# Patient Record
Sex: Female | Born: 1985 | Race: Black or African American | Hispanic: No | Marital: Single | Smoking: Former smoker
Health system: Southern US, Community
[De-identification: ages and names within clinical notes are randomized; demographics above are authoritative.]

## PROBLEM LIST (undated history)

## (undated) DIAGNOSIS — N938 Other specified abnormal uterine and vaginal bleeding: Secondary | ICD-10-CM

## (undated) DIAGNOSIS — I878 Other specified disorders of veins: Secondary | ICD-10-CM

## (undated) DIAGNOSIS — D649 Anemia, unspecified: Secondary | ICD-10-CM

## (undated) DIAGNOSIS — G459 Transient cerebral ischemic attack, unspecified: Secondary | ICD-10-CM

## (undated) HISTORY — PX: LAPAROSCOPIC GASTRIC SLEEVE RESECTION: SHX5895

---

## 2011-03-21 ENCOUNTER — Emergency Department (HOSPITAL_COMMUNITY): Payer: Self-pay

## 2011-03-21 ENCOUNTER — Other Ambulatory Visit (HOSPITAL_COMMUNITY): Payer: Self-pay

## 2011-03-21 ENCOUNTER — Emergency Department (HOSPITAL_COMMUNITY)
Admission: EM | Admit: 2011-03-21 | Discharge: 2011-03-22 | Disposition: A | Discharge status: Home / Self Care | Payer: Self-pay | Attending: Emergency Medicine | Admitting: Emergency Medicine

## 2011-03-21 ENCOUNTER — Encounter (HOSPITAL_COMMUNITY): Payer: Self-pay | Admitting: *Deleted

## 2011-03-21 ENCOUNTER — Other Ambulatory Visit: Payer: Self-pay

## 2011-03-21 DIAGNOSIS — Z8673 Personal history of transient ischemic attack (TIA), and cerebral infarction without residual deficits: Secondary | ICD-10-CM | POA: Insufficient documentation

## 2011-03-21 DIAGNOSIS — M542 Cervicalgia: Secondary | ICD-10-CM | POA: Insufficient documentation

## 2011-03-21 DIAGNOSIS — R071 Chest pain on breathing: Secondary | ICD-10-CM | POA: Insufficient documentation

## 2011-03-21 DIAGNOSIS — H538 Other visual disturbances: Secondary | ICD-10-CM | POA: Insufficient documentation

## 2011-03-21 DIAGNOSIS — R42 Dizziness and giddiness: Secondary | ICD-10-CM | POA: Insufficient documentation

## 2011-03-21 DIAGNOSIS — J45909 Unspecified asthma, uncomplicated: Secondary | ICD-10-CM | POA: Insufficient documentation

## 2011-03-21 DIAGNOSIS — R51 Headache: Secondary | ICD-10-CM

## 2011-03-21 DIAGNOSIS — R112 Nausea with vomiting, unspecified: Secondary | ICD-10-CM | POA: Insufficient documentation

## 2011-03-21 HISTORY — DX: Transient cerebral ischemic attack, unspecified: G45.9

## 2011-03-21 MED ORDER — DIPHENHYDRAMINE HCL 50 MG/ML IJ SOLN
25.0000 mg | Freq: Once | INTRAMUSCULAR | Status: AC
  Administered 2011-03-21: 25 mg via INTRAVENOUS
  Filled 2011-03-21: qty 1

## 2011-03-21 MED ORDER — SODIUM CHLORIDE 0.9 % IV BOLUS (SEPSIS)
1000.0000 mL | Freq: Once | INTRAVENOUS | Status: AC
  Administered 2011-03-21: 1000 mL via INTRAVENOUS

## 2011-03-21 MED ORDER — METOCLOPRAMIDE HCL 5 MG/ML IJ SOLN
10.0000 mg | Freq: Once | INTRAMUSCULAR | Status: AC
  Administered 2011-03-21: 10 mg via INTRAVENOUS
  Filled 2011-03-21: qty 2

## 2011-03-21 MED ORDER — LIDOCAINE HCL 1 % IJ SOLN
INTRAMUSCULAR | Status: AC
  Filled 2011-03-21: qty 20

## 2011-03-21 MED ORDER — HYDROCODONE-ACETAMINOPHEN 5-500 MG PO TABS: 1.0000 | ORAL_TABLET | Freq: Four times a day (QID) | ORAL | Status: AC | PRN

## 2011-03-21 NOTE — ED Notes (Signed)
Pt c/o HA x1 week, has been worsening. No relief with OTC meds. Has been making chest and neck hurt. Also c/o blurred vision.

## 2011-03-21 NOTE — ED Notes (Signed)
Pt to radiology.

## 2011-03-21 NOTE — ED Notes (Signed)
Patient is resting comfortably. Warm blanket given  

## 2011-03-21 NOTE — ED Notes (Signed)
Patient to CT for a CT guided LP. Permit signed by MD and patient

## 2011-03-21 NOTE — ED Notes (Signed)
Secondary Assessment: c/o severe headache x 1 week, consistent, pain 9/10, involves the whole head, pain is throbbing, pressure and sharp.  sensitive to light, blurry vision, double vision, dizzy. Tried Norco at home with no relief. Never been diagnosed with migraine. Pt is alert, oriented. Airway intact. No weakness.

## 2011-03-21 NOTE — ED Notes (Signed)
Report given to Night RN

## 2011-03-21 NOTE — ED Provider Notes (Signed)
History     CSN: [id number] Arrival date & time: 03/21/2011  3:32 PM   First MD Initiated Contact with Patient 03/21/11 1740      Chief Complaint  Patient presents with  . Headache    (Consider location/radiation/quality/duration/timing/severity/associated sxs/prior treatment) HPI  Patient presents to emergency department complaining of a one-week history of gradual onset diffuse headache that over the last week gradually increasing in severity with associated photophobia, nausea and vomiting, dizziness, and blurred vision. Patient states she has history of waxing and waning headaches throughout her lifetime but states that the headaches usually resolve within a day. Patient states she took a Norco from previous clavicle injury without any relief of pain. Patient states she has history of TIA but states the underlying cause was "due to stress" denying history of high cholesterol, high blood pressure, or any other known medical problems other than asthma. Patient states she recently moved from [place] and has not established care with a primary care physician in [place]. Patient states the headache is also associated with muscle aches upper neck and chest stating tenderness to palpation lateral neck and chest wall pain aggravated by cough and movement. Denies fevers, chills, neck stiffness, shortness of breath, rash, difficulty ambulating, or loss of coordination.  Past Medical History  Diagnosis Date  . Asthma   . Transient ischemic attack (TIA)     History reviewed. No pertinent past surgical history.  No family history on file.  History  Substance Use Topics  . Smoking status: Never Smoker   . Smokeless tobacco: Not on file  . Alcohol Use: No    OB History    Grav Para Term Preterm Abortions TAB SAB Ect Mult Living                  Review of Systems  All other systems reviewed and are negative.    Allergies  Review of patient's allergies indicates no known  allergies.  Home Medications   Current Outpatient Rx  Name Route Sig Dispense Refill  . HYDROCODONE-ACETAMINOPHEN 5-325 MG PO TABS Oral Take 1 tablet by mouth every 6 (six) hours as needed.        BP 129/68  Pulse 77  Temp(Src) 98.7 F (37.1 C) (Oral)  Resp 20  SpO2 99%  LMP 03/14/2011  Physical Exam  Nursing note and vitals reviewed. Constitutional: She is oriented to person, place, and time. Vital signs are normal. She appears well-developed and well-nourished.  Non-toxic appearance. No distress.  HENT:  Head: Normocephalic and atraumatic.  Eyes: Conjunctivae and EOM are normal. Pupils are equal, round, and reactive to light. Right eye exhibits no nystagmus. Left eye exhibits no nystagmus.  Neck: Normal range of motion. Neck supple. No Brudzinski's sign and no Kernig's sign noted.  Cardiovascular: Normal rate, regular rhythm, normal heart sounds and intact distal pulses.  Exam reveals no gallop and no friction rub.   No murmur heard. Pulmonary/Chest: Effort normal and breath sounds normal. No respiratory distress. She has no wheezes. She has no rales. She exhibits tenderness.       Tenderness to palpation of entire anterior chest wall without crepitus, skin changes, or erythema. Tenderness to palpation also up within lateral lower aspects of neck and shoulders.  Abdominal: Bowel sounds are normal. She exhibits no distension and no mass. There is no tenderness. There is no rebound and no guarding.  Musculoskeletal: Normal range of motion. She exhibits no edema and no tenderness.  Neurological: She is alert  and oriented to person, place, and time.  Skin: Skin is warm and dry. No rash noted. She is not diaphoretic. No erythema.  Psychiatric: She has a normal mood and affect.    ED Course  Procedures (including critical care time)  IV Benadryl, Reglan, and fluids.  Patient continues to be alert and oriented with no neuro focal findings and nontoxic-appearing with improving  headache after IV pain meds and fluids are ongoing headache. Patient to sign consent form for LP to rule out pseudo-tumor cerebri.  Labs Reviewed - No data to display No results found.   No diagnosis found.    MDM  No acute findings on CT scan of head with no neurofocal findings. Patient pending LP for further evaluation.         Jenness Corner, [place] 03/22/11 [phone]

## 2011-03-21 NOTE — ED Provider Notes (Signed)
BP 119/58  Pulse 73  Temp(Src) 98.7 F (37.1 C) (Oral)  Resp 16  SpO2 100%  LMP 03/14/2011  Failed LP x2, likely 2/2 body habitus. Pt states pain improved at this time. I do not suspect SAH or meningitis/encephalitis. D/W Radiology (interventional), Since pain controlled will send home with analgesia and have LP as outpatient to evaluate for pseudotumor. Ordered in EPIC. Patient given careful instructions for follow up. Advised to return to ER at any time for worsening pain, nausea, vomiting, or if she is concerned.     Forbes Cellar, MD 03/21/11 2257

## 2011-03-21 NOTE — ED Notes (Signed)
Patient transported to X-ray.  Unable to do visual acuity at this time.

## 2011-03-24 NOTE — ED Provider Notes (Signed)
Medical screening examination/treatment/procedure(s) were conducted as a shared visit with non-physician practitioner(s) and myself.  I personally evaluated the patient during the encounter  See additional documentation by myself. Pt with headache, nausea, blurry vision from baseline. Suspect pseudotumor cerebri. CN II-XIII intact. Headache/pain contolled at this time. D/W Radiology and patient. She will have fluoro guided LP as outpatient  Forbes Cellar, MD 03/24/11 2042

## 2011-10-03 ENCOUNTER — Emergency Department (HOSPITAL_COMMUNITY): Payer: Self-pay

## 2011-10-03 ENCOUNTER — Emergency Department (HOSPITAL_COMMUNITY)
Admission: EM | Admit: 2011-10-03 | Discharge: 2011-10-03 | Disposition: A | Discharge status: Home / Self Care | Payer: Self-pay | Attending: Emergency Medicine | Admitting: Emergency Medicine

## 2011-10-03 ENCOUNTER — Encounter (HOSPITAL_COMMUNITY): Payer: Self-pay | Admitting: Emergency Medicine

## 2011-10-03 DIAGNOSIS — R109 Unspecified abdominal pain: Secondary | ICD-10-CM | POA: Insufficient documentation

## 2011-10-03 DIAGNOSIS — Z8679 Personal history of other diseases of the circulatory system: Secondary | ICD-10-CM | POA: Insufficient documentation

## 2011-10-03 DIAGNOSIS — A599 Trichomoniasis, unspecified: Secondary | ICD-10-CM | POA: Insufficient documentation

## 2011-10-03 LAB — DIFFERENTIAL
Basophils Absolute: 0 10*3/uL (ref 0.0–0.1)
Basophils Relative: 0 % (ref 0–1)
Eosinophils Absolute: 0.2 10*3/uL (ref 0.0–0.7)
Eosinophils Relative: 2 % (ref 0–5)
Lymphocytes Relative: 32 % (ref 12–46)
Lymphs Abs: 3.1 10*3/uL (ref 0.7–4.0)
Monocytes Absolute: 0.6 10*3/uL (ref 0.1–1.0)
Monocytes Relative: 6 % (ref 3–12)
Neutro Abs: 5.8 10*3/uL (ref 1.7–7.7)
Neutrophils Relative %: 60 % (ref 43–77)

## 2011-10-03 LAB — COMPREHENSIVE METABOLIC PANEL
ALT: 9 U/L (ref 0–35)
AST: 14 U/L (ref 0–37)
Albumin: 3.2 g/dL — ABNORMAL LOW (ref 3.5–5.2)
Alkaline Phosphatase: 86 U/L (ref 39–117)
BUN: 7 mg/dL (ref 6–23)
CO2: 27 mEq/L (ref 19–32)
Calcium: 9.1 mg/dL (ref 8.4–10.5)
Chloride: 101 mEq/L (ref 96–112)
Creatinine, Ser: 0.71 mg/dL (ref 0.50–1.10)
GFR calc Af Amer: >90 mL/min (ref >90)
GFR calc non Af Amer: >90 mL/min (ref >90)
Glucose, Bld: 115 mg/dL — ABNORMAL HIGH (ref 70–99)
Potassium: 3.2 mEq/L — ABNORMAL LOW (ref 3.5–5.1)
Sodium: 137 mEq/L (ref 135–145)
Total Bilirubin: 0.2 mg/dL — ABNORMAL LOW (ref 0.3–1.2)
Total Protein: 7.6 g/dL (ref 6.0–8.3)

## 2011-10-03 LAB — CBC
HCT: 31.2 % — ABNORMAL LOW (ref 36.0–46.0)
Hemoglobin: 9.4 g/dL — ABNORMAL LOW (ref 12.0–15.0)
MCH: 22.7 pg — ABNORMAL LOW (ref 26.0–34.0)
MCHC: 30.1 g/dL (ref 30.0–36.0)
MCV: 75.2 fL — ABNORMAL LOW (ref 78.0–100.0)
Platelets: 255 10*3/uL (ref 150–400)
RBC: 4.15 MIL/uL (ref 3.87–5.11)
RDW: 18 % — ABNORMAL HIGH (ref 11.5–15.5)
WBC: 9.7 10*3/uL (ref 4.0–10.5)

## 2011-10-03 LAB — URINALYSIS, ROUTINE W REFLEX MICROSCOPIC
Bilirubin Urine: NEGATIVE
Glucose, UA: NEGATIVE mg/dL
Hgb urine dipstick: NEGATIVE
Ketones, ur: NEGATIVE mg/dL
Nitrite: NEGATIVE
Protein, ur: NEGATIVE mg/dL
Specific Gravity, Urine: 1.031 — ABNORMAL HIGH (ref 1.005–1.030)
Urobilinogen, UA: 1 mg/dL (ref 0.0–1.0)
pH: 5.5 (ref 5.0–8.0)

## 2011-10-03 LAB — LIPASE, BLOOD: Lipase: 16 U/L (ref 11–59)

## 2011-10-03 LAB — URINE MICROSCOPIC-ADD ON

## 2011-10-03 LAB — POCT PREGNANCY, URINE: Preg Test, Ur: NEGATIVE

## 2011-10-03 MED ORDER — IOHEXOL 300 MG/ML  SOLN
100.0000 mL | Freq: Once | INTRAMUSCULAR | Status: AC | PRN
  Administered 2011-10-03: 100 mL via INTRAVENOUS

## 2011-10-03 MED ORDER — METRONIDAZOLE 500 MG PO TABS: 500.0000 mg | ORAL_TABLET | Freq: Two times a day (BID) | ORAL | Status: AC

## 2011-10-03 MED ORDER — IOHEXOL 300 MG/ML  SOLN: 20.0000 mL | INTRAMUSCULAR | Status: AC

## 2011-10-03 MED ORDER — AZITHROMYCIN 250 MG PO TABS
1000.0000 mg | ORAL_TABLET | Freq: Once | ORAL | Status: AC
  Administered 2011-10-03: 1000 mg via ORAL
  Filled 2011-10-03: qty 4

## 2011-10-03 MED ORDER — NAPROXEN 500 MG PO TABS: 500.0000 mg | ORAL_TABLET | Freq: Two times a day (BID) | ORAL | Status: DC

## 2011-10-03 MED ORDER — HYDROMORPHONE HCL PF 1 MG/ML IJ SOLN
1.0000 mg | Freq: Once | INTRAMUSCULAR | Status: AC
  Administered 2011-10-03: 1 mg via INTRAVENOUS
  Filled 2011-10-03: qty 1

## 2011-10-03 MED ORDER — PROMETHAZINE HCL 25 MG PO TABS: 25.0000 mg | ORAL_TABLET | Freq: Four times a day (QID) | ORAL | Status: DC | PRN

## 2011-10-03 MED ORDER — ONDANSETRON HCL 4 MG/2ML IJ SOLN
4.0000 mg | Freq: Once | INTRAMUSCULAR | Status: AC
Start: 2011-10-03 — End: 2011-10-03
  Administered 2011-10-03: 4 mg via INTRAVENOUS
  Filled 2011-10-03: qty 2

## 2011-10-03 MED ORDER — CEFTRIAXONE SODIUM 250 MG IJ SOLR
250.0000 mg | Freq: Once | INTRAMUSCULAR | Status: AC
  Administered 2011-10-03: 250 mg via INTRAMUSCULAR
  Filled 2011-10-03: qty 250

## 2011-10-03 MED ORDER — DEXTROSE 5 % IV SOLN: 250.0000 mg | Freq: Once | INTRAVENOUS | Status: DC

## 2011-10-03 MED ORDER — OXYCODONE-ACETAMINOPHEN 5-325 MG PO TABS: 1.0000 | ORAL_TABLET | ORAL | Status: AC | PRN

## 2011-10-03 NOTE — ED Notes (Signed)
Patient in radiology

## 2011-10-03 NOTE — ED Notes (Signed)
Pt completed one cup of contrast. No n/v or pain. Reminded pt to notify when 2nd cup is complete.

## 2011-10-03 NOTE — ED Notes (Signed)
Patient waiting post medication. Will continue to monitor.

## 2011-10-03 NOTE — ED Notes (Signed)
Patient returned from radiology. No needs at this time. Will continue to monitor.

## 2011-10-03 NOTE — ED Notes (Signed)
Pt given oral contrast for CT scan. Pt aware that she needs to call when complete. Will continue to monitor.

## 2011-10-03 NOTE — ED Notes (Signed)
No reaction to medication. No swelling, rash, itching, or SOB. Pt discharged.

## 2011-10-03 NOTE — ED Provider Notes (Signed)
History     CSN: [id number]  Arrival date & time 10/03/11  0114   First MD Initiated Contact with Patient 10/03/11 0146      Chief Complaint  Patient presents with  . Abdominal Pain    (Consider location/radiation/quality/duration/timing/severity/associated sxs/prior treatment) HPI Comments: Morbidly obese 26 year old female who presents to the hospital with 2 months of right-sided abdominal pain. She states that this has been intermittent, occurs occasionally but in the last 12 hours it has been persistent. It started at 3:00 PM yesterday afternoon and has been persistent throughout the evening. It is a sharp pain a severe pain and nothing seems to make it better. It is made worse with ambulation, palpation of the right side of the abdomen. She does complain of some nausea and did have some vomiting earlier in the evening. She denies changes in bowel habits or urinary habits and has no fevers coughing shortness of breath rashes or swelling. She denies any history of abdominal surgery  Patient is a 26 y.o. female presenting with abdominal pain. The history is provided by the patient.  Abdominal Pain The primary symptoms of the illness include abdominal pain.    Past Medical History  Diagnosis Date  . Asthma   . Transient ischemic attack (TIA)     History reviewed. No pertinent past surgical history.  History reviewed. No pertinent family history.  History  Substance Use Topics  . Smoking status: Never Smoker   . Smokeless tobacco: Not on file  . Alcohol Use: No    OB History    Grav Para Term Preterm Abortions TAB SAB Ect Mult Living                  Review of Systems  Gastrointestinal: Positive for abdominal pain.  All other systems reviewed and are negative.    Allergies  Review of patient's allergies indicates no known allergies.  Home Medications   Current Outpatient Rx  Name Route Sig Dispense Refill  . HYDROCODONE-ACETAMINOPHEN 5-325 MG PO TABS Oral  Take 1 tablet by mouth every 6 (six) hours as needed. For pain    . IBUPROFEN 200 MG PO TABS Oral Take 800 mg by mouth every 6 (six) hours as needed. For pain    . METRONIDAZOLE 500 MG PO TABS Oral Take 1 tablet (500 mg total) by mouth 2 (two) times daily. 14 tablet 0  . NAPROXEN 500 MG PO TABS Oral Take 1 tablet (500 mg total) by mouth 2 (two) times daily with a meal. 30 tablet 0  . OXYCODONE-ACETAMINOPHEN 5-325 MG PO TABS Oral Take 1 tablet by mouth every 4 (four) hours as needed for pain. May take 2 tablets PO q 6 hours for severe pain - Do not take with Tylenol as this tablet already contains tylenol 15 tablet 0  . PROMETHAZINE HCL 25 MG PO TABS Oral Take 1 tablet (25 mg total) by mouth every 6 (six) hours as needed for nausea. 12 tablet 0    BP 120/57  Pulse 66  Temp(Src) 97.8 F (36.6 C) (Oral)  Resp 18  Wt 404 lb (183.253 kg)  SpO2 99%  LMP 09/14/2011  Physical Exam  Nursing note and vitals reviewed. Constitutional: She appears well-developed and well-nourished. No distress.  HENT:  Head: Normocephalic and atraumatic.  Mouth/Throat: Oropharynx is clear and moist. No oropharyngeal exudate.  Eyes: Conjunctivae and EOM are normal. Pupils are equal, round, and reactive to light. Right eye exhibits no discharge. Left eye exhibits no discharge.  No scleral icterus.  Neck: Normal range of motion. Neck supple. No JVD present. No thyromegaly present.  Cardiovascular: Normal rate, regular rhythm, normal heart sounds and intact distal pulses.  Exam reveals no gallop and no friction rub.   No murmur heard. Pulmonary/Chest: Effort normal and breath sounds normal. No respiratory distress. She has no wheezes. She has no rales.  Abdominal: Soft. Bowel sounds are normal. She exhibits no distension and no mass. There is tenderness ( Diffuse right-sided tenderness in the right lower quadrant or the right upper quadrant, no guarding, no masses). There is guarding. There is no rebound.    Musculoskeletal: Normal range of motion. She exhibits no edema and no tenderness.  Lymphadenopathy:    She has no cervical adenopathy.  Neurological: She is alert. Coordination normal.  Skin: Skin is warm and dry. No rash noted. No erythema.  Psychiatric: She has a normal mood and affect. Her behavior is normal.    ED Course  Procedures (including critical care time)  Labs Reviewed  CBC - Abnormal; Notable for the following:    Hemoglobin 9.4 (*)    HCT 31.2 (*)    MCV 75.2 (*)    MCH 22.7 (*)    RDW 18.0 (*)    All other components within normal limits  COMPREHENSIVE METABOLIC PANEL - Abnormal; Notable for the following:    Potassium 3.2 (*)    Glucose, Bld 115 (*)    Albumin 3.2 (*)    Total Bilirubin 0.2 (*)    All other components within normal limits  URINALYSIS, ROUTINE W REFLEX MICROSCOPIC - Abnormal; Notable for the following:    APPearance CLOUDY (*)    Specific Gravity, Urine 1.031 (*)    Leukocytes, UA SMALL (*)    All other components within normal limits  URINE MICROSCOPIC-ADD ON - Abnormal; Notable for the following:    Squamous Epithelial / LPF MANY (*)    Bacteria, UA MANY (*)    All other components within normal limits  DIFFERENTIAL  LIPASE, BLOOD  POCT PREGNANCY, URINE   Ct Abdomen Pelvis W Contrast  10/03/2011  *RADIOLOGY REPORT*  Clinical Data: Right upper quadrant abdominal pain  CT ABDOMEN AND PELVIS WITH CONTRAST  Technique:  Multidetector CT imaging of the abdomen and pelvis was performed following the standard protocol during bolus administration of intravenous contrast.  Contrast: 1 OMNIPAQUE IOHEXOL 300 MG/ML  SOLN, OMNIPAQUE IOHEXOL 300 MG/ML  SOLN  Comparison: None.  Findings: Limited images through the lung bases demonstrate no significant appreciable abnormality. The heart size is within normal limits. No pleural or pericardial effusion.  Suboptimal contrast opacification.  Significant streak artifact secondary to patient contact with the  gantry.  Within this limitation, no focal abnormality identified within the liver, spleen, pancreas, biliary system, adrenal glands, kidneys.  No hydronephrosis or hydroureter.  Unable to follow the ureteral course in their entirety however no calcification along the expected course  No bowel obstruction.  No CT evidence for colitis.  Normal appendix.  No free intraperitoneal air or fluid.  No lymphadenopathy.  Normal caliber vasculature.  Decompressed bladder.  Unremarkable CT appearance to the uterus and adnexa.  No acute osseous finding  IMPRESSION: Degraded by streak artifact from patient contact with the gantry and poor contrast opacification.  Within this limitation, no acute CT abnormality identified.  Original Report Authenticated By: Waneta Martins, M.D.     1. Abdominal  pain, other specified site   2. Trichomonas  MDM  Abd pain, r/o cholecystitis / appy less likely given history, VS normal.  Repeat abdominal exam shows minimal tenderness, CT scan reviewed with the patient showing no signs of acute appendicitis, cholecystitis or liver abnormalities. Her symptoms have essentially gone away, urinalysis will require culture as it appears to be contaminated. I have treated her for Trichomonas, gonorrhea and Chlamydia. She has declined a pelvic exam but due to her history of Trichomonas will treat for these comorbid infections. Vital signs are normal at this time, I have encouraged her to return within 12 hours for a repeat abdominal exam is still having pain. Resource list given for followup  Discharge Prescriptions include:  #1 Phenergan  #2 Flagyl  #3 Naprosyn  #4 Percocet  Vida Roller, MD 10/03/11 [phone]

## 2011-10-03 NOTE — ED Notes (Signed)
Pt reports RUQ abd pain onset x2 months and has become unbearable

## 2011-10-03 NOTE — Discharge Instructions (Signed)
Pelvic Infection  If you have been diagnosed with a pelvic infection such as a sexually transmitted disease, you will need to be treated with antibiotics. Please take the medicines as prescribed. Some of these tests do not come back for 1-2 days in which case if they turn positive you will receive a phone call to let you know. If you are contacted and do have an infection consistent with a sexually transmitted disease, then you will need to tell any and all sexual partners that you have had in the last 6 months no so that they can be tested and treated as well. If you should develop severe or worsening pain in your abdomen or the pelvis or develop severe fevers,nausea or vomiting that prevent you from taking your medications, return to the emergency department immediately. Otherwise contact your local physician or county health department for a follow up appointment to complete STD testing including HIV and syphilis.  See the list of phone numbers below.  RESOURCE GUIDE  Dental Problems  Patients with Medicaid: [facility] [phone] [address]                                           [phone] Phone:  [phone]                                                  Phone:  [phone]  If unable to pay or uninsured, contact:  [facility] or [facility] to become qualified for the adult dental clinic.  Chronic Pain Problems Contact [facility]  [phone] Patients need to be referred by their primary care doctor.  Insufficient Money for Medicine Contact [employer]:  call "211" or [facility] (276)197-7802.  No Primary Care Doctor Call [facility]  4082459357 Other agencies that provide inexpensive medical care    [facility]  [phone]    [facility]  [phone]    [facility]  [phone]    [facility]  [phone]    [facility]  [phone]  [facility]  [phone]  Psychological Services [facility]  [phone] [facility]  [phone] [facility]   [phone] (emergency services (934)[phone])  Substance Abuse Resources [facility]  [phone] [facility] [phone])490-1807 [facility] [place] [phone] Floydene Flock [phone] Residential & Outpatient Substance Abuse Program  [phone]  Abuse/Neglect [facility] Child Abuse Hotline 323-103-4274 [facility] Child Abuse Hotline (865)883-7144 (After Hours)  Emergency Shelter [facility] [phone]  Maternity Homes [facility] [place] [facility] [place] 9521181929 [facility] [phone]  MRSA Hotline #:   (916) 454-9739    [facility] Resources  [facility] [place]     [employer]                          [facility] [address] [place]  [address]      [address]  Blondell Reveal Phone:  [phone]                                   Phone:  [phone]                 Phone:  [phone]  [facility] Mental Health Phone:  [phone]  [facility] Child Abuse Hotline (618) 397-1471 (531)445-4489 (After Hours)   You have been diagnosed with undifferentiated abdominal pain.  Abdominal pain can be caused by many things. Your caregiver evaluates the seriousness of your pain by an examination and possibly blood or urine tests and imaging (CT scan, x-rays, ultrasound). Many cases can be observed and treated at home after initial evaluation in the emergency department. Even though you are being discharged home, abdominal pain can be unpredictable. Therefore, you need a repeat exam if your pain does not resolve, returns, or worsens. Most patient's with abdominal pain do not need to be admitted to the  hospital or have surgery, but serious problems like appendicitis and gallbladder attacks can start out as nonspecific pain. Many abdominal conditions cannot be diagnosed in 1 visit, so followup evaluations are very important.  Seek immediate medical attention if:  *The pain does not go away or becomes severe. *Temperature above 101 develops *Repeated vomiting occurs(multiple episodes) *The pain becomes localized to portions of the abdomen. The right side could possibly be appendicitis. In an adult, the left lower portion of the abdomen could be colitis or diverticulitis. *Blood is being passed in stools or vomit *Return also if you develop chest pain, difficulty breathing, dizziness or fainting, or become confused poorly responsive or inconsolable (young children).

## 2011-10-03 NOTE — ED Notes (Signed)
Radiology made aware that pt is done with contrast.  

## 2011-10-03 NOTE — ED Notes (Signed)
Pt stated that she has been having right quadrant pain that radiates to right flank x 2 months. No n/v/d. No poor appetite. Pt stated that pain is worse with deep breaths. No burning with urination. No vaginal itching or discharge. Will continue to monitor.

## 2011-10-04 LAB — URINE CULTURE
Colony Count: >=100000
Culture  Setup Time: 201305201307

## 2012-05-26 ENCOUNTER — Emergency Department (INDEPENDENT_AMBULATORY_CARE_PROVIDER_SITE_OTHER)
Admission: EM | Admit: 2012-05-26 | Discharge: 2012-05-26 | Disposition: A | Discharge status: Home / Self Care | Payer: Self-pay | Source: Home / Self Care | Attending: Family Medicine | Admitting: Family Medicine

## 2012-05-26 ENCOUNTER — Encounter (HOSPITAL_COMMUNITY): Payer: Self-pay | Admitting: Emergency Medicine

## 2012-05-26 DIAGNOSIS — B3789 Other sites of candidiasis: Secondary | ICD-10-CM

## 2012-05-26 DIAGNOSIS — J45909 Unspecified asthma, uncomplicated: Secondary | ICD-10-CM

## 2012-05-26 DIAGNOSIS — J069 Acute upper respiratory infection, unspecified: Secondary | ICD-10-CM

## 2012-05-26 MED ORDER — IBUPROFEN 600 MG PO TABS: 600.0000 mg | ORAL_TABLET | Freq: Three times a day (TID) | ORAL | Status: DC | PRN

## 2012-05-26 MED ORDER — ALBUTEROL SULFATE HFA 108 (90 BASE) MCG/ACT IN AERS: 1.0000 | INHALATION_SPRAY | Freq: Four times a day (QID) | RESPIRATORY_TRACT | Status: DC | PRN

## 2012-05-26 MED ORDER — CETIRIZINE-PSEUDOEPHEDRINE ER 5-120 MG PO TB12: 1.0000 | ORAL_TABLET | Freq: Two times a day (BID) | ORAL | Status: DC

## 2012-05-26 MED ORDER — NYSTATIN 100000 UNIT/GM EX CREA: TOPICAL_CREAM | Freq: Two times a day (BID) | CUTANEOUS | Status: DC

## 2012-05-26 MED ORDER — GUAIFENESIN-CODEINE 100-10 MG/5ML PO SYRP: 5.0000 mL | ORAL_SOLUTION | Freq: Three times a day (TID) | ORAL | Status: DC | PRN

## 2012-05-26 NOTE — ED Notes (Signed)
Reports cough, headache , chest soreness, generalized aching, episodes of sweating, chills.  Onset Wednesday of symptoms.  No known fever .

## 2012-05-26 NOTE — ED Notes (Signed)
Provided blankets and repositioned bed for patient comfort

## 2012-05-31 NOTE — ED Provider Notes (Signed)
History     CSN: [id number]  Arrival date & time 05/26/12  1249   First MD Initiated Contact with Patient 05/26/12 1258      Chief Complaint  Patient presents with  . URI    (Consider location/radiation/quality/duration/timing/severity/associated sxs/prior treatment) HPI Comments: 27 y/o female non smoker with h/o asthma here c/o dry cough, nasal congestion, headaches, general malaise and body aches for 3 days. Symptoms associated with intermittent wheezing. Patient using expired inhaler with minimal improvement. Denies pleuritic type of chest pain.  Also c/o a pruriginous rash in chest below both breasts was initially itching now burning. Has not taken any medications today. Appetite stable.    Past Medical History  Diagnosis Date  . Asthma   . Transient ischemic attack (TIA)     History reviewed. No pertinent past surgical history.  No family history on file.  History  Substance Use Topics  . Smoking status: Never Smoker   . Smokeless tobacco: Not on file  . Alcohol Use: No    OB History    Grav Para Term Preterm Abortions TAB SAB Ect Mult Living                  Review of Systems  Constitutional: Positive for chills. Negative for fever and appetite change.  HENT: Positive for congestion and rhinorrhea.   Eyes: Negative for discharge.  Respiratory: Positive for cough and wheezing.   Cardiovascular: Negative for chest pain.  Gastrointestinal: Negative for nausea, vomiting, abdominal pain and diarrhea.  Genitourinary: Negative for dysuria, frequency and hematuria.  Musculoskeletal: Positive for myalgias and arthralgias. Negative for joint swelling.  Skin: Negative for rash.  Neurological: Positive for headaches. Negative for dizziness.  All other systems reviewed and are negative.    Allergies  Review of patient's allergies indicates no known allergies.  Home Medications   Current Outpatient Rx  Name  Route  Sig  Dispense  Refill  . ALBUTEROL SULFATE  HFA 108 (90 BASE) MCG/ACT IN AERS   Inhalation   Inhale 1-2 puffs into the lungs every 6 (six) hours as needed for wheezing.   1 Inhaler   0   . CETIRIZINE-PSEUDOEPHEDRINE ER 5-120 MG PO TB12   Oral   Take 1 tablet by mouth 2 (two) times daily. As needed nasal congestion   20 tablet   0   . GUAIFENESIN-CODEINE 100-10 MG/5ML PO SYRP   Oral   Take 5 mLs by mouth 3 (three) times daily as needed for cough.   120 mL   0   . IBUPROFEN 600 MG PO TABS   Oral   Take 1 tablet (600 mg total) by mouth every 8 (eight) hours as needed for pain or fever.   30 tablet   0   . NYSTATIN 100000 UNIT/GM EX CREA   Topical   Apply topically 2 (two) times daily.   30 g   0     BP 133/83  Pulse 83  Temp 99.6 F (37.6 C) (Oral)  Resp 18  SpO2 98%  LMP 05/12/2012  Physical Exam  Vitals reviewed. Constitutional: She is oriented to person, place, and time. She appears well-developed and well-nourished. No distress.  HENT:  Head: Normocephalic and atraumatic.       Nasal Congestion with erythema and swelling of nasal turbinates, clear rhinorrhea. pharyngeal erythema no exudates. No uvula deviation. No trismus. TM's with increased vascular markings and some dullness bilaterally no swelling or bulging   Eyes: Conjunctivae normal and EOM  are normal. Pupils are equal, round, and reactive to light. Right eye exhibits no discharge. Left eye exhibits no discharge. No scleral icterus.  Neck: Neck supple. No JVD present. No thyromegaly present.  Cardiovascular: Normal rate, regular rhythm, normal heart sounds and intact distal pulses.  Exam reveals no gallop and no friction rub.   No murmur heard. Pulmonary/Chest: Effort normal and breath sounds normal. No respiratory distress. She has no rales. She exhibits no tenderness.       Very mild sporadic expiratory rhonchi. No active wheezing. No tachypnea or orthopnea.   Abdominal: Soft. She exhibits no distension. There is no tenderness. There is no  rebound and no guarding.  Lymphadenopathy:    She has no cervical adenopathy.  Neurological: She is alert and oriented to person, place, and time.  Skin: No rash noted. She is not diaphoretic.    ED Course  Procedures (including critical care time)  Labs Reviewed - No data to display No results found.   1. Upper respiratory infection   2. Asthma   3. Candidiasis of breast       MDM  Refilled albuterol. Prescribed ibuprofen, cetirizine, guaifenesin with codeine and nystatin cream. Supportive care and red flags that should prompt her return to medical attention discussed with patient and provided in writing.         Sharin Grave, MD 05/31/12 [phone]

## 2012-10-13 ENCOUNTER — Encounter (HOSPITAL_COMMUNITY): Payer: Self-pay | Admitting: Emergency Medicine

## 2012-10-13 ENCOUNTER — Emergency Department (INDEPENDENT_AMBULATORY_CARE_PROVIDER_SITE_OTHER)
Admission: EM | Admit: 2012-10-13 | Discharge: 2012-10-13 | Disposition: A | Discharge status: Nursing Home (Includes ICF) | Payer: Self-pay | Source: Home / Self Care | Attending: Emergency Medicine | Admitting: Emergency Medicine

## 2012-10-13 ENCOUNTER — Emergency Department (HOSPITAL_COMMUNITY)
Admission: EM | Admit: 2012-10-13 | Discharge: 2012-10-13 | Disposition: A | Discharge status: Home / Self Care | Payer: Self-pay | Attending: Emergency Medicine | Admitting: Emergency Medicine

## 2012-10-13 DIAGNOSIS — I872 Venous insufficiency (chronic) (peripheral): Secondary | ICD-10-CM | POA: Insufficient documentation

## 2012-10-13 DIAGNOSIS — I8312 Varicose veins of left lower extremity with inflammation: Secondary | ICD-10-CM

## 2012-10-13 DIAGNOSIS — Z79899 Other long term (current) drug therapy: Secondary | ICD-10-CM | POA: Insufficient documentation

## 2012-10-13 DIAGNOSIS — I878 Other specified disorders of veins: Secondary | ICD-10-CM

## 2012-10-13 DIAGNOSIS — M7989 Other specified soft tissue disorders: Secondary | ICD-10-CM

## 2012-10-13 DIAGNOSIS — I8310 Varicose veins of unspecified lower extremity with inflammation: Secondary | ICD-10-CM | POA: Insufficient documentation

## 2012-10-13 DIAGNOSIS — Z8673 Personal history of transient ischemic attack (TIA), and cerebral infarction without residual deficits: Secondary | ICD-10-CM | POA: Insufficient documentation

## 2012-10-13 DIAGNOSIS — R079 Chest pain, unspecified: Secondary | ICD-10-CM

## 2012-10-13 DIAGNOSIS — I82402 Acute embolism and thrombosis of unspecified deep veins of left lower extremity: Secondary | ICD-10-CM

## 2012-10-13 DIAGNOSIS — J45909 Unspecified asthma, uncomplicated: Secondary | ICD-10-CM | POA: Insufficient documentation

## 2012-10-13 LAB — POCT I-STAT, CHEM 8
BUN: 7 mg/dL (ref 6–23)
Calcium, Ion: 1.23 mmol/L (ref 1.12–1.23)
Chloride: 105 mEq/L (ref 96–112)
Creatinine, Ser: 0.7 mg/dL (ref 0.50–1.10)
Glucose, Bld: 87 mg/dL (ref 70–99)
HCT: 31 % — ABNORMAL LOW (ref 36.0–46.0)
Hemoglobin: 10.5 g/dL — ABNORMAL LOW (ref 12.0–15.0)
Potassium: 3.7 mEq/L (ref 3.5–5.1)
Sodium: 143 mEq/L (ref 135–145)
TCO2: 31 mmol/L (ref 0–100)

## 2012-10-13 MED ORDER — TRAMADOL HCL 50 MG PO TABS
50.0000 mg | ORAL_TABLET | Freq: Once | ORAL | Status: AC
  Administered 2012-10-13: 50 mg via ORAL
  Filled 2012-10-13: qty 1

## 2012-10-13 MED ORDER — IBUPROFEN 600 MG PO TABS: 600.0000 mg | ORAL_TABLET | Freq: Four times a day (QID) | ORAL | Status: DC | PRN

## 2012-10-13 MED ORDER — HYDROCODONE-ACETAMINOPHEN 5-325 MG PO TABS
2.0000 | ORAL_TABLET | Freq: Once | ORAL | Status: AC
  Administered 2012-10-13: 2 via ORAL

## 2012-10-13 MED ORDER — HYDROCODONE-ACETAMINOPHEN 5-325 MG PO TABS: ORAL_TABLET | ORAL | Status: DC

## 2012-10-13 MED ORDER — HYDROCODONE-ACETAMINOPHEN 5-325 MG PO TABS
ORAL_TABLET | ORAL | Status: AC
  Filled 2012-10-13: qty 2

## 2012-10-13 MED ORDER — CEPHALEXIN 500 MG PO CAPS: 500.0000 mg | ORAL_CAPSULE | Freq: Four times a day (QID) | ORAL | Status: DC

## 2012-10-13 NOTE — ED Notes (Signed)
Patient reports pain in left leg from the knee down, onset Wednesday, no known injury

## 2012-10-13 NOTE — Progress Notes (Signed)
NCM, Mena Pauls spoke to pt and info was given on community clinic. Pt will make appt. Pt's Keflex was $4 at [facility]. Pt paid out of pocket and MATCH was not used. Isidoro Donning RN CCM Case Mgmt phone [phone]

## 2012-10-13 NOTE — ED Notes (Signed)
Pt sent from [facility] for eval for left leg pain with increased swelling; pt sts some intermittent CP/SOB but denies at present

## 2012-10-13 NOTE — ED Provider Notes (Signed)
Chief Complaint:   Chief Complaint  Patient presents with  . 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 She's never been given any treatment for this, and recently moved here from [place]. Over the past 4 days her left leg is swollen up more than usual, has been painful and feels hot. It feels numb, tingly, and burns. She denies any injury to the leg. She has pain with movement of the ankle and decreased range of motion the ankle. The pain is rated 9/10 in intensity. It's worse if she stands and walks but doesn't really get better she gets off her feet and elevates it. Also for the past 4-5 days she's had some pain in her left hemithorax. This is centered around the shoulder but radiates towards the sternum, upper back, and down the side of the chest. It seems to be worse with movement it is nonpleuritic. She denies any shortness of breath, cough, or hemoptysis. She has had no prior history of DVT. She has had no other risk factors such as use of estrogens or prolonged immobilization.  Review of Systems:  Other than noted above, the patient denies any of the following symptoms: Systemic:  No fever, chills, sweats, weight gain or loss. Respiratory:  No coughing, wheezing, or shortness of breath. Cardiac:  No chest pain, tightness, pressure or syncope. GI:  No abdominal pain, swelling, distension, nausea, or vomiting. GU:  No dysuria, frequency, or hematuria. Ext:  No joint pain, muscle pain, or weakness. Skin:  No rash or itching. Neuro:  No paresthesias.  PMFSH:  Past medical history, family history, social history, meds, and allergies were reviewed.    Physical Exam:   Vital signs:  BP 139/83  Pulse 72  Temp(Src) 98.3 F (36.8 C) (Oral)  Resp 18  SpO2 96%  LMP 09/27/2012 Gen:  Alert, oriented, in no distress. Neck:  No tenderness, adenopathy, or JVD. Lungs:  Breath  sounds clear and equal bilaterally.  No rales, rhonchi or wheezes. Heart:  Regular rhythm, no gallops or murmers. Abdomen:  Soft, nontender, no organomegaly or mass. Ext:  She has nonpitting edema of both legs with stasis dermatitis and hyperpigmentation. The left leg is swollen more than the right with a calf circumference of 51 cm on the left and 48 cm on the right. There is tenderness to palpation the calf and a positive Homans sign. The ankle has a decreased range of motion with pain. Pulses were full. Neuro:  Alert and oriented times 3.  No muscle weakness.  Sensation intact to light touch. Skin:  Warm and dry.  No rash or skin lesions.  Course in Urgent Care Center:  Given Norco 5/325, 2 tablets for pain.  Assessment:  The primary encounter diagnosis was Venous stasis. Diagnoses of Stasis dermatitis of both legs, DVT (deep venous thrombosis), left, and Chest pain were also pertinent to this visit.  History is concerning for DVT and possible pulmonary embolus. I think she will need further evaluation and if positive treatment.  Plan:   1.  The following meds were prescribed:   New Prescriptions   No medications on file   2.  The patient was transported to the emergency department via shuttle in stable condition.  Medical Decision Making:  27 year old female with chronic venous stasis of both legs has a 4 days history of increased pain  and swelling in left lower leg.  Denies any injury.  No prior history of DVT.  On exam she is tender in calf with non-pitting edema.  She also admits to non-pleuritic pain in left chest for past 5 days.  No shortness of breath.  Symptoms concerning for DVT and possibly PE.       Reuben Likes, MD 10/13/12 1230

## 2012-10-13 NOTE — Progress Notes (Signed)
VASCULAR LAB PRELIMINARY  PRELIMINARY  PRELIMINARY  PRELIMINARY  Left lower extremity venous duplex completed.    Preliminary report:  Left:  No obvious evidence of DVT, superficial thrombosis, or Baker's cyst.  Technically limited by body habitus and thickened skin in the calf.   Bailey Villarreal, RVT 10/13/2012, 2:16 PM

## 2012-10-13 NOTE — ED Provider Notes (Signed)
History     CSN: [id number]  Arrival date & time 10/13/12  1239   First MD Initiated Contact with Patient 10/13/12 1247      Chief Complaint  Patient presents with  . Leg Pain    (Consider location/radiation/quality/duration/timing/severity/associated sxs/prior treatment) HPI Comments: Patient with history of venous stasis both legs with stasis dermatitis, morbid obesity -- presents with complaint that over the past 4 days her left leg is swollen up more than usual, has been painful and feels hot. It feels numb, tingly, and burns at times, especially when she is walking. She denies any injury to the leg.  The pain is rated 9/10 in intensity. It's worse if she stands and walks but doesn't really get better when she sits and elevates it. Patient has had occasional pain around the shoulder but radiates towards the sternum, upper back, and down the side of the chest. This started prior to the leg swelling and has been relatively mild since the leg pain/swelling began. It seems to be worse with movement it is nonpleuritic. She denies any shortness of breath, cough, or hemoptysis. She has had no prior history of DVT. She has had no other risk factors such as use of estrogens or prolonged immobilization. Onset of symptoms gradual, course is gradually worsening. Patient was seen at [facility] prior to arrival and legs were measured, showing 3cm greater circumference in the left leg.   Patient is a 27 y.o. female presenting with leg pain. The history is provided by the patient and medical records.  Leg Pain Associated symptoms: no fever     Past Medical History  Diagnosis Date  . Asthma   . Transient ischemic attack (TIA)     History reviewed. No pertinent past surgical history.  History reviewed. No pertinent family history.  History  Substance Use Topics  . Smoking status: Never Smoker   . Smokeless tobacco: Not on file  . Alcohol Use: No    OB History   Grav Para Term Preterm Abortions TAB  SAB Ect Mult Living                  Review of Systems  Constitutional: Negative for fever.  HENT: Negative for sore throat and rhinorrhea.   Eyes: Negative for redness.  Respiratory: Negative for cough and shortness of breath.   Cardiovascular: Positive for leg swelling. Negative for chest pain.  Gastrointestinal: Negative for nausea, vomiting, abdominal pain and diarrhea.  Genitourinary: Negative for dysuria.  Musculoskeletal: Positive for arthralgias. Negative for myalgias.  Skin: Negative for rash.  Neurological: Negative for headaches.    Allergies  Review of patient's allergies indicates no known allergies.  Home Medications   Current Outpatient Rx  Name  Route  Sig  Dispense  Refill  . albuterol (PROVENTIL HFA;VENTOLIN HFA) 108 (90 BASE) MCG/ACT inhaler   Inhalation   Inhale 1-2 puffs into the lungs every 6 (six) hours as needed for wheezing.   1 Inhaler   0   . cetirizine-pseudoephedrine (ZYRTEC-D) 5-120 MG per tablet   Oral   Take 1 tablet by mouth 2 (two) times daily. As needed nasal congestion   20 tablet   0   . guaiFENesin-codeine (ROBITUSSIN AC) 100-10 MG/5ML syrup   Oral   Take 5 mLs by mouth 3 (three) times daily as needed for cough.   120 mL   0   . ibuprofen (ADVIL,MOTRIN) 600 MG tablet   Oral   Take 1 tablet (600 mg total) by  mouth every 8 (eight) hours as needed for pain or fever.   30 tablet   0   . nystatin cream (MYCOSTATIN)   Topical   Apply topically 2 (two) times daily.   30 g   0     BP 153/94  Pulse 79  Temp(Src) 98.3 F (36.8 C) (Oral)  Resp 18  Ht 6\' 1"  (1.854 m)  Wt 350 lb (158.759 kg)  BMI 46.19 kg/m2  SpO2 100%  LMP 09/27/2012  Physical Exam  Nursing note and vitals reviewed. Constitutional: She appears well-developed and well-nourished.  HENT:  Head: Normocephalic and atraumatic.  Eyes: Conjunctivae are normal. Right eye exhibits no discharge. Left eye exhibits no discharge.  Neck: Normal range of motion. Neck  supple.  Cardiovascular: Normal rate, regular rhythm and normal heart sounds.   Pulses:      Dorsalis pedis pulses are 2+ on the right side, and 2+ on the left side.       Posterior tibial pulses are 2+ on the right side, and 2+ on the left side.  Pulmonary/Chest: Effort normal and breath sounds normal. No respiratory distress. She has no wheezes. She has no rales.  Abdominal: Soft. There is no tenderness.  Musculoskeletal: She exhibits edema. She exhibits no tenderness.  Moderate to severe chronic venous stasis changes of LE bilaterally. The left lower extremity is slightly warm to touch compared to the right. There is light overlying erythema. 1+ pitting edema bilaterally. No palpable cords.   Neurological: She is alert.  Skin: Skin is warm and dry.  Psychiatric: She has a normal mood and affect.    ED Course  Procedures (including critical care time)  Labs Reviewed  POCT I-STAT, CHEM 8 - Abnormal; Notable for the following:    Hemoglobin 10.5 (*)    HCT 31.0 (*)    All other components within normal limits   No results found.   1. Venous insufficiency   2. Stasis dermatitis, acute, left     1:07 PM Patient seen and examined. Work-up initiated.   Vital signs reviewed and are as follows: Filed Vitals:   10/13/12 1244  BP: 153/94  Pulse: 79  Temp: 98.3 F (36.8 C)  Resp: 18   Ultrasound shows no DT. Patient currently has no chest pain or shortness of breath.  Patient d/w Dr. Oletta Lamas.   Patient to be discharged home on course of Keflex. Pt urged to return with worsening pain, worsening swelling, expanding area of redness or streaking up extremity, fever, or any other concerns. Urged to take complete course of antibiotics as prescribed. Counseled to take pain medications as prescribed. Pt verbalizes understanding and agrees with plan.  Patient counseled on use of narcotic pain medications. Counseled not to combine these medications with others containing tylenol. Urged not  to drink alcohol, drive, or perform any other activities that requires focus while taking these medications. The patient verbalizes understanding and agrees with the plan.  Encouraged patient to apply compression stockings and use for severe venous stasis.  Family to help patient find primary care physician.   MDM  Patients with history of severe venous stasis of bilateral lower extremities. Patient had worsening swelling and pain of left lower extremity. Initial concern is for DVT. Lower extremity Doppler did not demonstrate DVT. Mild swelling is likely due to overlying secondary cellulitis, which is mild. Patient does not have fever, nausea, vomiting, lymphangitis or any indications for admission at this time. She appears well, nontoxic. Patient does endorse vague,  intermittent chest pain that began long before the swelling of her leg and is actually well-controlled at present. Do not suspect PE given patient presentation.        Renne Crigler, [name] 10/13/12 1747

## 2012-10-13 NOTE — ED Notes (Signed)
NAD noted at time of d/c home. Will wait in ED waiting for case manager to finish with medications.

## 2012-10-14 NOTE — ED Provider Notes (Signed)
Medical screening examination/treatment/procedure(s) were performed by non-physician practitioner and as supervising physician I was immediately available for consultation/collaboration.   Gavin Pound. Ngoc Detjen, MD 10/14/12 1056

## 2013-03-16 ENCOUNTER — Encounter (HOSPITAL_COMMUNITY): Payer: Self-pay | Admitting: Emergency Medicine

## 2013-03-16 ENCOUNTER — Emergency Department (INDEPENDENT_AMBULATORY_CARE_PROVIDER_SITE_OTHER)
Admission: EM | Admit: 2013-03-16 | Discharge: 2013-03-16 | Disposition: A | Discharge status: Home / Self Care | Payer: Self-pay | Source: Home / Self Care

## 2013-03-16 DIAGNOSIS — M940 Chondrocostal junction syndrome [Tietze]: Secondary | ICD-10-CM

## 2013-03-16 HISTORY — DX: Morbid (severe) obesity due to excess calories: E66.01

## 2013-03-16 HISTORY — DX: Other specified disorders of veins: I87.8

## 2013-03-16 MED ORDER — PREDNISONE 5 MG PO TABS: 5.0000 mg | ORAL_TABLET | Freq: Every day | ORAL | Status: AC

## 2013-03-16 NOTE — ED Notes (Addendum)
States she had been worked up early in year for possible DVT/PE, but was found to be negative; this chest pain is different than any she's had in past.

## 2013-03-16 NOTE — ED Provider Notes (Signed)
CSN: [id number]     Arrival date & time 03/16/13  1330 History   None    Chief Complaint  Patient presents with  . Chest Pain   (Consider location/radiation/quality/duration/timing/severity/associated sxs/prior Treatment) HPI Comments: Patient reports a 3 day history of left chest pain and tenderness with palpation and ROM with some pain on deep inspiration. Radiates to left shoulder which is also tender to palpation and with reaching. No trauma. No history of illness prior. No N,V, diaphoresis.   Patient is a 27 y.o. female presenting with chest pain. The history is provided by the patient.  Chest Pain   Past Medical History  Diagnosis Date  . Asthma   . Transient ischemic attack (TIA)   . Venous stasis    History reviewed. No pertinent past surgical history. No family history on file. History  Substance Use Topics  . Smoking status: Former Games developer  . Smokeless tobacco: Not on file  . Alcohol Use: No   OB History   Grav Para Term Preterm Abortions TAB SAB Ect Mult Living                 Review of Systems  Cardiovascular: Positive for chest pain.  All other systems reviewed and are negative.    Allergies  Review of patient's allergies indicates no known allergies.  Home Medications   Current Outpatient Rx  Name  Route  Sig  Dispense  Refill  . cephALEXin (KEFLEX) 500 MG capsule   Oral   Take 1 capsule (500 mg total) by mouth 4 (four) times daily.   28 capsule   0   . HYDROcodone-acetaminophen (NORCO/VICODIN) 5-325 MG per tablet      Take 1-2 tablets every 6 hours as needed for severe pain   10 tablet   0   . ibuprofen (ADVIL,MOTRIN) 600 MG tablet   Oral   Take 1 tablet (600 mg total) by mouth every 6 (six) hours as needed for pain.   20 tablet   0   . predniSONE (DELTASONE) 5 MG tablet   Oral   Take 1 tablet (5 mg total) by mouth daily.   40 tablet   0    BP 149/77  Pulse 74  Temp(Src) 98.1 F (36.7 C) (Oral)  Resp 22  SpO2 100%  LMP  02/19/2013 Physical Exam  Nursing note and vitals reviewed. Constitutional: She is oriented to person, place, and time. She appears well-developed and well-nourished. No distress.  No distress looks comfortable  HENT:  Head: Normocephalic and atraumatic.  Neck: Normal range of motion. Neck supple.  Cardiovascular: Normal rate, regular rhythm and normal heart sounds.  Exam reveals no gallop and no friction rub.   No murmur heard. Pulmonary/Chest: Effort normal and breath sounds normal. No respiratory distress. She has no wheezes. She has no rales. She exhibits tenderness.  Reproducible chest pain in the left anterior chest region into left shoulder region,reproduced with ROM of left shoulder as well. No spasms are noted  Neurological: She is alert and oriented to person, place, and time.  Skin: Skin is warm and dry. She is not diaphoretic.  Psychiatric: Her behavior is normal.    ED Course  Procedures (including critical care time) Labs Review Labs Reviewed - No data to display Imaging Review No results found.  EKG Interpretation     Ventricular Rate:    PR Interval:    QRS Duration:   QT Interval:    QTC Calculation:   R Axis:  Text Interpretation:              MDM   1. Costochondritis   Normal EKG, Symptoms consistent with reprodcuible pain on exam. Treat with 6 day Prednisone. 15mg x 2 days, 10mg  x 2 days, 5 mgx 2 days (RX edited after print)  (States Ibuprofen had been unsuccessful)    Riki Sheer, [name] 03/16/13 1404

## 2013-03-16 NOTE — ED Provider Notes (Signed)
Medical screening examination/treatment/procedure(s) were performed by non-physician practitioner and as supervising physician I was immediately available for consultation/collaboration.  Timiya Howells, M.D.  Estanisl[facility] Surgeon C Zae Kirtz, MD 03/16/13 1824 

## 2013-03-16 NOTE — ED Notes (Signed)
C/O constant left chest pain x3 days radiating up into shoulder and around left ribcage.  Entire area is tender to palpation, worse with movement, and occasionally with breathing.  Does feel SOB.  Denies any injuries or change in activity.  Has tried 400mg  IBU, a family member's muscle relaxer, and heating pad without any relief.

## 2013-08-11 ENCOUNTER — Encounter (HOSPITAL_COMMUNITY): Payer: Self-pay | Admitting: Emergency Medicine

## 2013-08-11 ENCOUNTER — Emergency Department (INDEPENDENT_AMBULATORY_CARE_PROVIDER_SITE_OTHER): Payer: Managed Care, Other (non HMO)

## 2013-08-11 ENCOUNTER — Emergency Department (INDEPENDENT_AMBULATORY_CARE_PROVIDER_SITE_OTHER)
Admission: EM | Admit: 2013-08-11 | Discharge: 2013-08-11 | Disposition: A | Discharge status: Home / Self Care | Payer: Managed Care, Other (non HMO) | Source: Home / Self Care | Attending: Family Medicine | Admitting: Family Medicine

## 2013-08-11 DIAGNOSIS — M25473 Effusion, unspecified ankle: Secondary | ICD-10-CM

## 2013-08-11 DIAGNOSIS — M25476 Effusion, unspecified foot: Secondary | ICD-10-CM

## 2013-08-11 DIAGNOSIS — M25471 Effusion, right ankle: Secondary | ICD-10-CM

## 2013-08-11 DIAGNOSIS — R071 Chest pain on breathing: Secondary | ICD-10-CM

## 2013-08-11 NOTE — Discharge Instructions (Signed)
Ms. Audley Hoseates,   You have swelling of the ankle without a fracture. This should improve if you take weight off of your ankle. You can do this with a walking boot or crutches. Also take the anti-inflammatory tablets and I will prescribe. You can also ice it during the day.  Most importantly, want to encourage you to continue to lose weight. This will take the pressure off of your joints and decrease your risk of continuous pain in the ankle.  Please call the sports medicine center as needed.  Dr. Clinton SawyerWilliamson

## 2013-08-11 NOTE — ED Provider Notes (Signed)
CSN: [id number]     Arrival date & time 08/11/13  1303 History   First MD Initiated Contact with Patient 08/11/13 1351     Chief Complaint  Patient presents with  . Foot Pain   (Consider location/radiation/quality/duration/timing/severity/associated sxs/prior Treatment) HPI  Ankle pain - right-sided, one-day duration, located on the medial side of the ankle, patient having difficulty with angulation weightbearing, no redness or warmth, no recent trauma no history of surgery no falls; patient has tried nothing for relief  Past Medical History  Diagnosis Date  . Asthma   . Transient ischemic attack (TIA)   . Venous stasis   . Morbid obesity    History reviewed. No pertinent past surgical history. History reviewed. No pertinent family history. History  Substance Use Topics  . Smoking status: Former Games developermoker  . Smokeless tobacco: Not on file  . Alcohol Use: No   OB History   Grav Para Term Preterm Abortions TAB SAB Ect Mult Living                 Review of Systems See history of present illness Allergies  Review of patient's allergies indicates no known allergies.  Home Medications   Current Outpatient Rx  Name  Route  Sig  Dispense  Refill  . cephALEXin (KEFLEX) 500 MG capsule   Oral   Take 1 capsule (500 mg total) by mouth 4 (four) times daily.   28 capsule   0   . HYDROcodone-acetaminophen (NORCO/VICODIN) 5-325 MG per tablet      Take 1-2 tablets every 6 hours as needed for severe pain   10 tablet   0   . ibuprofen (ADVIL,MOTRIN) 600 MG tablet   Oral   Take 1 tablet (600 mg total) by mouth every 6 (six) hours as needed for pain.   20 tablet   0    BP 157/69  Pulse 63  Temp(Src) 98.9 F (37.2 C) (Oral)  Resp 16  SpO2 100%  LMP 07/20/2013 Physical Exam Gen: morbidly obese young [place] female, nondistressed, sitting in a wheelchair from the clinic Right leg: chronic hemosiderin deposition distal to the knee, moderate tenderness to palpation of  medial malleolus posterior medial malleolus and distal medial tibia with decreased active range of motion of the ankle; right foot neurovascularly intact without any tenderness ED Course  Procedures (including critical care time) Labs Review Labs Reviewed - No data to display Imaging Review Dg Ankle Complete Right  08/11/2013   CLINICAL DATA:  Right ankle pain medially  EXAM: RIGHT ANKLE - COMPLETE 3+ VIEW  COMPARISON:  None.  FINDINGS: Abnormal medial soft tissue swelling. No malleolar fracture. Plafond and talar dome appear intact.  Potential pes planus on the lateral projection, although projection was not obtained with patient standing. Dorsal soft tissue swelling along the talonavicular articulation.  IMPRESSION: 1. Medial and dorsal soft tissue swelling in the ankle, without observed underlying fracture. If pain persists despite conservative therapy, MRI may be warranted for further characterization.   Electronically Signed   By: Herbie BaltimoreWalt  Liebkemann M.D.   On: 08/11/2013 15:01     MDM   1. Right ankle swelling    Patient suffering from severe right ankle pain likely due to morbidly obese body habitus and poor support from her footwear. She was given a cam walker and crutches to help decrease her weightbearing in the next week. F/u with sports medicine PRN.     Garnetta BuddyEdward V Cleophas Yoak, MD 08/11/13 [phone]

## 2013-08-11 NOTE — ED Notes (Signed)
Denies injury.  Assessment per Dr. Clinton SawyerWilliamson.

## 2013-08-14 NOTE — ED Provider Notes (Signed)
Medical screening examination/treatment/procedure(s) were performed by a resident physician or non-physician practitioner and as the supervising physician I was immediately available for consultation/collaboration.  Kainalu Heggs, MD    Ebubechukwu Jedlicka S Timira Bieda, MD 08/14/13 0757 

## 2013-10-16 ENCOUNTER — Emergency Department (INDEPENDENT_AMBULATORY_CARE_PROVIDER_SITE_OTHER): Payer: Managed Care, Other (non HMO)

## 2013-10-16 ENCOUNTER — Emergency Department (INDEPENDENT_AMBULATORY_CARE_PROVIDER_SITE_OTHER)
Admission: EM | Admit: 2013-10-16 | Discharge: 2013-10-16 | Disposition: A | Discharge status: Home / Self Care | Payer: Managed Care, Other (non HMO) | Source: Home / Self Care | Attending: Family Medicine | Admitting: Family Medicine

## 2013-10-16 ENCOUNTER — Encounter (HOSPITAL_COMMUNITY): Payer: Self-pay | Admitting: Emergency Medicine

## 2013-10-16 DIAGNOSIS — R071 Chest pain on breathing: Secondary | ICD-10-CM

## 2013-10-16 DIAGNOSIS — R0789 Other chest pain: Secondary | ICD-10-CM

## 2013-10-16 MED ORDER — DICLOFENAC POTASSIUM 50 MG PO TABS: 50.0000 mg | ORAL_TABLET | Freq: Three times a day (TID) | ORAL | Status: DC

## 2013-10-16 NOTE — Discharge Instructions (Signed)
Heat and medicine as needed, activity as tolerated, return to ER  If problem gets worse.

## 2013-10-16 NOTE — ED Notes (Signed)
Pt  Reports  Pain l  Side   /  Flank  Chest  Area        Pain is  Worse  When  She  Takes  A  Deep    Breath    Or  When  She  Bends    Or  On  Certain movements

## 2013-10-16 NOTE — ED Provider Notes (Signed)
CSN: [id number]     Arrival date & time 10/16/13  1514 History   First MD Initiated Contact with Patient 10/16/13 1535     Chief Complaint  Patient presents with  . Flank Pain   (Consider location/radiation/quality/duration/timing/severity/associated sxs/prior Treatment) Patient is a 28 y.o. female presenting with flank pain. The history is provided by the patient.  Flank Pain This is a new problem. The current episode started 2 days ago (awoke with pain on mon , continues to palp and movement.). Associated symptoms include chest pain. Pertinent negatives include no shortness of breath.    Past Medical History  Diagnosis Date  . Asthma   . Transient ischemic attack (TIA)   . Venous stasis   . Morbid obesity    History reviewed. No pertinent past surgical history. History reviewed. No pertinent family history. History  Substance Use Topics  . Smoking status: Former Games developer  . Smokeless tobacco: Not on file  . Alcohol Use: No   OB History   Grav Para Term Preterm Abortions TAB SAB Ect Mult Living                 Review of Systems  Constitutional: Negative.   Respiratory: Negative for shortness of breath.   Cardiovascular: Positive for chest pain. Negative for palpitations and leg swelling.  Genitourinary: Positive for flank pain.    Allergies  Review of patient's allergies indicates no known allergies.  Home Medications   Prior to Admission medications   Medication Sig Start Date End Date Taking? Authorizing Provider  cephALEXin (KEFLEX) 500 MG capsule Take 1 capsule (500 mg total) by mouth 4 (four) times daily. 10/13/12   Renne Crigler, [name]  diclofenac (CATAFLAM) 50 MG tablet Take 1 tablet (50 mg total) by mouth 3 (three) times daily. For chest pain 10/16/13   Linna Hoff, MD  HYDROcodone-acetaminophen (NORCO/VICODIN) 5-325 MG per tablet Take 1-2 tablets every 6 hours as needed for severe pain 10/13/12   Renne Crigler, [name]  ibuprofen (ADVIL,MOTRIN) 600 MG tablet Take 1  tablet (600 mg total) by mouth every 6 (six) hours as needed for pain. 10/13/12   Renne Crigler, [name]   BP 151/93  Pulse 76  Temp(Src) 98.5 F (36.9 C) (Oral)  Resp 18  SpO2 100%  LMP 10/14/2013 Physical Exam  Nursing note and vitals reviewed. Constitutional: She is oriented to person, place, and time. She appears well-developed and well-nourished.  Cardiovascular: Regular rhythm and normal heart sounds.   Pulmonary/Chest: Effort normal and breath sounds normal. She has no rales. She exhibits tenderness.  Neurological: She is alert and oriented to person, place, and time.  Skin: Skin is warm and dry.    ED Course  Procedures (including critical care time) Labs Review Labs Reviewed - No data to display  Imaging Review Dg Chest 2 View  10/16/2013   CLINICAL DATA:  Chest pain  EXAM: CHEST  2 VIEW  COMPARISON:  None.  FINDINGS: There is no edema or consolidation. The heart appears mildly enlarged with normal pulmonary vascularity. No pneumothorax. No adenopathy. No bone lesions.  IMPRESSION: Heart appears prominent.  No edema or consolidation.   Electronically Signed   By: Bretta Bang M.D.   On: 10/16/2013 16:22    X-rays reviewed and report per radiologist.  MDM   1. Left-sided chest wall pain        Linna Hoff, MD 10/16/13 1700

## 2013-10-17 ENCOUNTER — Ambulatory Visit: Payer: Managed Care, Other (non HMO) | Admitting: Internal Medicine

## 2015-12-03 DIAGNOSIS — F4322 Adjustment disorder with anxiety: Secondary | ICD-10-CM | POA: Diagnosis not present

## 2016-01-04 DIAGNOSIS — D500 Iron deficiency anemia secondary to blood loss (chronic): Secondary | ICD-10-CM | POA: Diagnosis not present

## 2016-01-04 DIAGNOSIS — Z6841 Body Mass Index (BMI) 40.0 and over, adult: Secondary | ICD-10-CM | POA: Diagnosis not present

## 2016-01-04 DIAGNOSIS — I878 Other specified disorders of veins: Secondary | ICD-10-CM | POA: Diagnosis not present

## 2016-04-11 DIAGNOSIS — M79645 Pain in left finger(s): Secondary | ICD-10-CM | POA: Diagnosis not present

## 2016-04-27 ENCOUNTER — Emergency Department (HOSPITAL_COMMUNITY)
Admission: EM | Admit: 2016-04-27 | Discharge: 2016-04-27 | Disposition: A | Discharge status: Home / Self Care | Payer: Managed Care, Other (non HMO) | Attending: Emergency Medicine | Admitting: Emergency Medicine

## 2016-04-27 ENCOUNTER — Encounter (HOSPITAL_BASED_OUTPATIENT_CLINIC_OR_DEPARTMENT_OTHER): Payer: Self-pay

## 2016-04-27 ENCOUNTER — Emergency Department (HOSPITAL_BASED_OUTPATIENT_CLINIC_OR_DEPARTMENT_OTHER): Payer: BLUE CROSS/BLUE SHIELD

## 2016-04-27 ENCOUNTER — Encounter (HOSPITAL_COMMUNITY): Payer: Self-pay | Admitting: Emergency Medicine

## 2016-04-27 ENCOUNTER — Emergency Department (HOSPITAL_BASED_OUTPATIENT_CLINIC_OR_DEPARTMENT_OTHER)
Admission: EM | Admit: 2016-04-27 | Discharge: 2016-04-28 | Disposition: A | Discharge status: Home / Self Care | Payer: BLUE CROSS/BLUE SHIELD | Attending: Emergency Medicine | Admitting: Emergency Medicine

## 2016-04-27 DIAGNOSIS — M79662 Pain in left lower leg: Secondary | ICD-10-CM | POA: Diagnosis present

## 2016-04-27 DIAGNOSIS — R200 Anesthesia of skin: Secondary | ICD-10-CM | POA: Diagnosis not present

## 2016-04-27 DIAGNOSIS — J45909 Unspecified asthma, uncomplicated: Secondary | ICD-10-CM | POA: Insufficient documentation

## 2016-04-27 DIAGNOSIS — R0602 Shortness of breath: Secondary | ICD-10-CM | POA: Insufficient documentation

## 2016-04-27 DIAGNOSIS — Z87891 Personal history of nicotine dependence: Secondary | ICD-10-CM | POA: Diagnosis not present

## 2016-04-27 DIAGNOSIS — R079 Chest pain, unspecified: Secondary | ICD-10-CM | POA: Insufficient documentation

## 2016-04-27 DIAGNOSIS — Z8673 Personal history of transient ischemic attack (TIA), and cerebral infarction without residual deficits: Secondary | ICD-10-CM | POA: Diagnosis not present

## 2016-04-27 DIAGNOSIS — M7989 Other specified soft tissue disorders: Secondary | ICD-10-CM

## 2016-04-27 DIAGNOSIS — R0789 Other chest pain: Secondary | ICD-10-CM | POA: Diagnosis not present

## 2016-04-27 DIAGNOSIS — Z5321 Procedure and treatment not carried out due to patient leaving prior to being seen by health care provider: Secondary | ICD-10-CM | POA: Diagnosis not present

## 2016-04-27 DIAGNOSIS — R609 Edema, unspecified: Secondary | ICD-10-CM | POA: Diagnosis not present

## 2016-04-27 DIAGNOSIS — R600 Localized edema: Secondary | ICD-10-CM | POA: Insufficient documentation

## 2016-04-27 LAB — COMPREHENSIVE METABOLIC PANEL
ALT: 11 U/L — ABNORMAL LOW (ref 14–54)
AST: 16 U/L (ref 15–41)
Albumin: 3.4 g/dL — ABNORMAL LOW (ref 3.5–5.0)
Alkaline Phosphatase: 91 U/L (ref 38–126)
Anion gap: 12 (ref 5–15)
BUN: 10 mg/dL (ref 6–20)
CO2: 25 mmol/L (ref 22–32)
Calcium: 9.2 mg/dL (ref 8.9–10.3)
Chloride: 104 mmol/L (ref 101–111)
Creatinine, Ser: 0.81 mg/dL (ref 0.44–1.00)
GFR calc Af Amer: >60 mL/min (ref >60)
GFR calc non Af Amer: >60 mL/min (ref >60)
Glucose, Bld: 103 mg/dL — ABNORMAL HIGH (ref 65–99)
Potassium: 3.4 mmol/L — ABNORMAL LOW (ref 3.5–5.1)
Sodium: 141 mmol/L (ref 135–145)
Total Bilirubin: 0.1 mg/dL — ABNORMAL LOW (ref 0.3–1.2)
Total Protein: 7.9 g/dL (ref 6.5–8.1)

## 2016-04-27 LAB — CBC WITH DIFFERENTIAL/PLATELET
Basophils Absolute: 0 10*3/uL (ref 0.0–0.1)
Basophils Relative: 0 %
Eosinophils Absolute: 0.1 10*3/uL (ref 0.0–0.7)
Eosinophils Relative: 1 %
HCT: 31.2 % — ABNORMAL LOW (ref 36.0–46.0)
Hemoglobin: 9 g/dL — ABNORMAL LOW (ref 12.0–15.0)
Lymphocytes Relative: 20 %
Lymphs Abs: 2.4 10*3/uL (ref 0.7–4.0)
MCH: 21.1 pg — ABNORMAL LOW (ref 26.0–34.0)
MCHC: 28.8 g/dL — ABNORMAL LOW (ref 30.0–36.0)
MCV: 73.2 fL — ABNORMAL LOW (ref 78.0–100.0)
Monocytes Absolute: 0.7 10*3/uL (ref 0.1–1.0)
Monocytes Relative: 6 %
Neutro Abs: 8.6 10*3/uL — ABNORMAL HIGH (ref 1.7–7.7)
Neutrophils Relative %: 73 %
Platelets: 275 10*3/uL (ref 150–400)
RBC: 4.26 MIL/uL (ref 3.87–5.11)
RDW: 19 % — ABNORMAL HIGH (ref 11.5–15.5)
WBC: 11.8 10*3/uL — ABNORMAL HIGH (ref 4.0–10.5)

## 2016-04-27 LAB — PREGNANCY, URINE: Preg Test, Ur: NEGATIVE

## 2016-04-27 MED ORDER — HYDROCODONE-ACETAMINOPHEN 5-325 MG PO TABS
1.0000 | ORAL_TABLET | Freq: Once | ORAL | Status: AC
Start: 2016-04-27 — End: 2016-04-27
  Administered 2016-04-27: 1 via ORAL
  Filled 2016-04-27: qty 1

## 2016-04-27 MED ORDER — ASPIRIN 81 MG PO CHEW
324.0000 mg | CHEWABLE_TABLET | Freq: Once | ORAL | Status: AC
  Administered 2016-04-27: 324 mg via ORAL
  Filled 2016-04-27: qty 4

## 2016-04-27 NOTE — ED Triage Notes (Signed)
C/o pain, swelling to left leg x 1 week-denies injury-p[facility]

## 2016-04-27 NOTE — ED Notes (Signed)
Patient transported to X-ray 

## 2016-04-27 NOTE — ED Provider Notes (Signed)
[facility]   CSN: [id number] Arrival date & time: 04/27/16  2219 By signing my name below, I, Bailey Villarreal, attest that this documentation has been prepared under the direction and in the presence of Bailey Rhineonald Manhattan Mccuen, MD . Electronically Signed: [name], Scribe. 04/27/2016. 11:28 PM.   History   Chief Complaint Chief Complaint  Patient presents with  . Leg Pain   HPI [name] is a 30 y.o. female with a hx of asthma who presents to the Emergency Department complaining of acute on chronic swelling to left leg onset last night. She notes associated left leg pain, numbness and tingling to her LLE, CP and SOB. She reports right-sided chest pain that does not radiate onset three days ago. She describes her pain as pressure. Per pt, the pain will last about 3 hours at a time. No alleviating or modifying factors noted.  No recent injury. Pt denies any cardiac history or VTE. She is not on any chronic medications. Pt denies any nausea, vomiting, or abdominal pain.   The history is provided by the patient. No language interpreter was used.  Leg Pain   This is a chronic problem. The current episode started 12 to 24 hours ago. The problem occurs constantly. The problem has not changed since onset.The pain is present in the left lower leg. The quality of the pain is described as sharp. The pain is moderate. Associated symptoms include numbness and tingling. She has tried nothing for the symptoms. There has been no history of extremity trauma. Family history is significant for no rheumatoid arthritis and no gout.   Past Medical History:  Diagnosis Date  . Asthma   . Morbid obesity (HCC)   . Transient ischemic attack (TIA)   . Venous stasis     There are no active problems to display for this patient.   History reviewed. No pertinent surgical history.  OB History    No data available       Home Medications    Prior to Admission medications   Not on  File    Family History No family history on file.  Social History Social History  Substance Use Topics  . Smoking status: Former Games developermoker  . Smokeless tobacco: Never Used  . Alcohol use No    Allergies   Patient has no known allergies.  Review of Systems Review of Systems  Respiratory: Positive for shortness of breath. Negative for cough.   Cardiovascular: Positive for chest pain and leg swelling.  Gastrointestinal: Negative for abdominal pain, blood in stool, nausea and vomiting.  Neurological: Positive for tingling and numbness.  All other systems reviewed and are negative.  Physical Exam Updated Vital Signs BP 153/79 (BP Location: Right Arm)   Pulse 82   Temp 98.4 F (36.9 C) (Oral)   Resp 20   Ht 5\' 10"  (1.778 m)   Wt (!) 422 lb (191.4 kg)   LMP 04/20/2016 (Approximate)   SpO2 100%   BMI 60.55 kg/m   Physical Exam CONSTITUTIONAL: Well developed/well nourished HEAD: Normocephalic/atraumatic EYES: EOMI/PERRL ENMT: Mucous membranes moist NECK: supple no meningeal signs SPINE/BACK:entire spine nontender CV: S1/S2 noted, no murmurs/rubs/gallops noted LUNGS: Lungs are clear to auscultation bilaterally, no apparent distress ABDOMEN: soft, nontender,obese GU:no cva tenderness NEURO: Pt is awake/alert/appropriate, moves all extremitiesx4.  No facial droop.  Equal strength in bilateral LE EXTREMITIES: pulses normal/equal, full ROM; chronic edema and venous stasis changes noted L > R; significant left calf tenderness but no erythema,  no signs of cellulitis; DP intact, no crepitus noted SKIN: warm, color normal PSYCH: no abnormalities of mood noted, alert and oriented to situation   ED Treatments / Results  DIAGNOSTIC STUDIES:  Oxygen Saturation is 100% on RA, normal by my interpretation.    COORDINATION OF CARE:  11:24 PM Discussed treatment plan with pt at bedside and pt agreed to plan.   Labs (all labs ordered are listed, but only abnormal results are  displayed) Labs Reviewed  PREGNANCY, URINE  TROPONIN I    EKG  EKG Interpretation  Date/Time:  Thursday April 28 2016 00:20:04 EST Ventricular Rate:  75 PR Interval:    QRS Duration: 80 QT Interval:  455 QTC Calculation: 509 R Axis:   27 Text Interpretation:  Sinus rhythm Borderline T abnormalities, anterior leads No significant change since last tracing Confirmed by Bebe ShaggyWICKLINE  MD, Lanna Labella ([id number]) on 04/28/2016 12:25:10 AM       Radiology Dg Chest 2 View  Result Date: 04/28/2016 CLINICAL DATA:  Right-sided chest pain with shortness of breath and left lower leg swelling EXAM: CHEST  2 VIEW COMPARISON:  10/16/2013 FINDINGS: No focal pulmonary infiltrate, consolidation or effusion. Stable borderline to mild cardiomegaly with mild central congestion. No overt edema. No pneumothorax. IMPRESSION: Stable borderline to mild cardiomegaly with mild central congestion. No overt edema. No effusions or acute consolidations. Electronically Signed   By: Jasmine PangKim  Fujinaga M.D.   On: 04/28/2016 00:00    Procedures Procedures (including critical care time)  Medications Ordered in ED Medications  aspirin chewable tablet 324 mg (324 mg Oral Given 04/27/16 2337)  HYDROcodone-acetaminophen (NORCO/VICODIN) 5-325 MG per tablet 1 tablet (1 tablet Oral Given 04/27/16 2337)  enoxaparin (LOVENOX) injection 190 mg (190 mg Subcutaneous Given 04/28/16 0101)     Initial Impression / Assessment and Plan / ED Course  I have reviewed the triage vital signs and the nursing notes.  Pertinent labs & imaging results that were available during my care of the patient were reviewed by me and considered in my medical decision making (see chart for details).  Clinical Course     12:34 AM Pt stable Currently PERC negative EKG unchanged She has cardiomegaly - advised of this and need for monitoring and advised this is due to body habitus.   Will need to return in AM for DVT study Pt has chronic anemia, reports  this not new 1:41 AM Pt stable Appears PERC negative I have low suspicion for ACS (ekg/troponin unremarkable) I doubt dissection given history/exam She was advised about cardiomegaly seen on xray, need for f/u and monitoring  She will return later this morning for DVT study lovenox given Final Clinical Impressions(s) / ED Diagnoses   Final diagnoses:  Chest pain, unspecified type  Edema of left lower extremity    New Prescriptions New Prescriptions   No medications on file  I personally performed the services described in this documentation, which was scribed in my presence. The recorded information has been reviewed and is accurate.        Bailey Rhineonald Brandt Chaney, MD 04/28/16 [phone]

## 2016-04-27 NOTE — ED Triage Notes (Signed)
Patient reports left lower leg pain with swelling /tingling onset this week , denies fever or chills , respirations unlabored .

## 2016-04-28 ENCOUNTER — Other Ambulatory Visit (HOSPITAL_BASED_OUTPATIENT_CLINIC_OR_DEPARTMENT_OTHER): Payer: Self-pay | Admitting: Emergency Medicine

## 2016-04-28 ENCOUNTER — Ambulatory Visit (HOSPITAL_BASED_OUTPATIENT_CLINIC_OR_DEPARTMENT_OTHER): Admit: 2016-04-28 | Payer: BLUE CROSS/BLUE SHIELD

## 2016-04-28 ENCOUNTER — Ambulatory Visit (HOSPITAL_BASED_OUTPATIENT_CLINIC_OR_DEPARTMENT_OTHER)
Admission: RE | Admit: 2016-04-28 | Discharge: 2016-04-28 | Disposition: A | Discharge status: Home / Self Care | Payer: BLUE CROSS/BLUE SHIELD | Source: Ambulatory Visit | Admitting: Emergency Medicine

## 2016-04-28 ENCOUNTER — Encounter (HOSPITAL_BASED_OUTPATIENT_CLINIC_OR_DEPARTMENT_OTHER): Payer: Self-pay

## 2016-04-28 DIAGNOSIS — M7989 Other specified soft tissue disorders: Secondary | ICD-10-CM | POA: Diagnosis not present

## 2016-04-28 DIAGNOSIS — M79662 Pain in left lower leg: Secondary | ICD-10-CM

## 2016-04-28 LAB — TROPONIN I: Troponin I: <0.03 ng/mL (ref <0.03)

## 2016-04-28 MED ORDER — ENOXAPARIN SODIUM 150 MG/ML ~~LOC~~ SOLN
1.0000 mg/kg | Freq: Once | SUBCUTANEOUS | Status: AC
  Administered 2016-04-28: 190 mg via SUBCUTANEOUS
  Filled 2016-04-28: qty 2

## 2016-04-28 NOTE — Discharge Instructions (Signed)

## 2016-04-28 NOTE — ED Provider Notes (Signed)
Patient returned today for lower extremity venous ultrasound. There is no DVT found. Patient to follow up with PCP. She states she will call today for an appointment. Patient understands and agrees with plan.   Emi Holeslexandra M Mandi Mattioli, [name] 04/28/16 1055    Charlynne Panderavid Hsienta Yao, MD 04/28/16 [phone]

## 2016-05-04 DIAGNOSIS — I517 Cardiomegaly: Secondary | ICD-10-CM | POA: Diagnosis not present

## 2016-05-04 DIAGNOSIS — R609 Edema, unspecified: Secondary | ICD-10-CM | POA: Diagnosis not present

## 2016-05-04 DIAGNOSIS — M79662 Pain in left lower leg: Secondary | ICD-10-CM | POA: Diagnosis not present

## 2016-05-18 ENCOUNTER — Telehealth: Payer: Self-pay

## 2016-05-18 NOTE — Telephone Encounter (Signed)
SENT NOTES TO SCHEDULING 

## 2016-09-07 DIAGNOSIS — Z6841 Body Mass Index (BMI) 40.0 and over, adult: Secondary | ICD-10-CM | POA: Diagnosis not present

## 2016-09-07 DIAGNOSIS — Z01419 Encounter for gynecological examination (general) (routine) without abnormal findings: Secondary | ICD-10-CM | POA: Diagnosis not present

## 2016-09-07 DIAGNOSIS — Z1151 Encounter for screening for human papillomavirus (HPV): Secondary | ICD-10-CM | POA: Diagnosis not present

## 2016-09-07 DIAGNOSIS — R875 Abnormal microbiological findings in specimens from female genital organs: Secondary | ICD-10-CM | POA: Diagnosis not present

## 2016-10-11 DIAGNOSIS — M79644 Pain in right finger(s): Secondary | ICD-10-CM | POA: Diagnosis not present

## 2016-10-12 DIAGNOSIS — Z113 Encounter for screening for infections with a predominantly sexual mode of transmission: Secondary | ICD-10-CM | POA: Diagnosis not present

## 2016-10-12 DIAGNOSIS — Z1159 Encounter for screening for other viral diseases: Secondary | ICD-10-CM | POA: Diagnosis not present

## 2016-10-12 DIAGNOSIS — A5901 Trichomonal vulvovaginitis: Secondary | ICD-10-CM | POA: Diagnosis not present

## 2016-10-12 DIAGNOSIS — Z114 Encounter for screening for human immunodeficiency virus [HIV]: Secondary | ICD-10-CM | POA: Diagnosis not present

## 2016-10-12 DIAGNOSIS — Z118 Encounter for screening for other infectious and parasitic diseases: Secondary | ICD-10-CM | POA: Diagnosis not present

## 2016-11-22 ENCOUNTER — Encounter (HOSPITAL_BASED_OUTPATIENT_CLINIC_OR_DEPARTMENT_OTHER): Payer: Self-pay

## 2016-11-22 ENCOUNTER — Emergency Department (HOSPITAL_BASED_OUTPATIENT_CLINIC_OR_DEPARTMENT_OTHER)
Admission: EM | Admit: 2016-11-22 | Discharge: 2016-11-22 | Disposition: A | Discharge status: Home / Self Care | Payer: BLUE CROSS/BLUE SHIELD | Attending: Emergency Medicine | Admitting: Emergency Medicine

## 2016-11-22 ENCOUNTER — Emergency Department (HOSPITAL_BASED_OUTPATIENT_CLINIC_OR_DEPARTMENT_OTHER): Payer: BLUE CROSS/BLUE SHIELD

## 2016-11-22 DIAGNOSIS — M25561 Pain in right knee: Secondary | ICD-10-CM | POA: Insufficient documentation

## 2016-11-22 DIAGNOSIS — Z8673 Personal history of transient ischemic attack (TIA), and cerebral infarction without residual deficits: Secondary | ICD-10-CM | POA: Diagnosis not present

## 2016-11-22 DIAGNOSIS — J45909 Unspecified asthma, uncomplicated: Secondary | ICD-10-CM | POA: Insufficient documentation

## 2016-11-22 DIAGNOSIS — Z87891 Personal history of nicotine dependence: Secondary | ICD-10-CM | POA: Insufficient documentation

## 2016-11-22 MED ORDER — NAPROXEN 375 MG PO TABS: 375.0000 mg | ORAL_TABLET | Freq: Two times a day (BID) | ORAL | 0 refills | Status: DC

## 2016-11-22 MED ORDER — NAPROXEN 250 MG PO TABS
375.0000 mg | ORAL_TABLET | Freq: Once | ORAL | Status: AC
  Administered 2016-11-22: 375 mg via ORAL
  Filled 2016-11-22: qty 2

## 2016-11-22 NOTE — ED Notes (Signed)
ED Provider at bedside. 

## 2016-11-22 NOTE — ED Triage Notes (Signed)
Woke up with pain to right knee this am-denies injury-presents to triage in w/c-stood to scale for weight-NAD

## 2016-11-22 NOTE — Discharge Instructions (Signed)
Wear the knee immobilizer for comfort. Use the crutches as tolerated. Weightbearing as tolerated. Please rest, ice, compress and elevated the affected body part to help with swelling and pain. Please take the Naproxen as prescribed for pain. Do not take any additional NSAIDs including Motrin, Aleve, Ibuprofen, Advil. Follow-up with the orthopedist if symptoms are not improving.

## 2016-11-22 NOTE — ED Provider Notes (Signed)
[facility]   CSN: [id number] Arrival date & time: 11/22/16  1220     History   Chief Complaint Chief Complaint  Patient presents with  . Knee Pain    HPI Bailey Villarreal is a 31 y.o. female.  HPI 31 year old African American female past medical history significant for morbid obesity presents to the ED today with complaints of right knee pain. Patient states that she woke up this morning with complaints of right knee pain. Movement and palpation makes the pain worse. Nothing makes the pain better. She has not tried nothing for her symptoms prior to arrival. Patient has limited range of motion due to pain. Denies any paresthesias or weakness. Denies any lower extremity edema or calf tenderness. Past Medical History:  Diagnosis Date  . Asthma   . Morbid obesity (HCC)   . Transient ischemic attack (TIA)   . Venous stasis     There are no active problems to display for this patient.   History reviewed. No pertinent surgical history.  OB History    No data available       Home Medications    Prior to Admission medications   Medication Sig Start Date End Date Taking? Authorizing Provider  naproxen (NAPROSYN) 375 MG tablet Take 1 tablet (375 mg total) by mouth 2 (two) times daily. 11/22/16   Rise Mu, [name]    Family History No family history on file.  Social History Social History  Substance Use Topics  . Smoking status: Former Games developer  . Smokeless tobacco: Never Used  . Alcohol use No     Allergies   Patient has no known allergies.   Review of Systems Review of Systems  Constitutional: Negative for chills and fever.  Cardiovascular: Negative for leg swelling.  Musculoskeletal: Positive for arthralgias and gait problem. Negative for joint swelling.  Skin: Negative for wound.  Neurological: Negative for weakness and numbness.     Physical Exam Updated Vital Signs BP (!) 154/80 (BP Location: Right Wrist)   Pulse 85    Temp 98.3 F (36.8 C) (Oral)   Resp 18   Ht 5\' 9"  (1.753 m)   Wt (!) 195 kg (430 lb)   LMP 10/20/2016   SpO2 100%   BMI 63.50 kg/m   Physical Exam  Constitutional: She appears well-developed and well-nourished. No distress.  HENT:  Head: Normocephalic and atraumatic.  Eyes: Right eye exhibits no discharge. Left eye exhibits no discharge. No scleral icterus.  Neck: Normal range of motion.  Cardiovascular: Intact distal pulses.   Pulmonary/Chest: No respiratory distress.  Musculoskeletal: Normal range of motion.  Patient is tender to palpation of the anterior right knee. No erythema, edema, ecchymosis, obvious deformity noted. No lower extremity edema. Patient has limited range of motion due to pain but does have full passive range of motion. Patella tracks normally. No joint laxity with fullness and varus stress. Negative anterior drawer or Lachman's test. DP pulses are 2+ bilaterally. Sensation intact. Cap refill normal. No calf tenderness.  Neurological: She is alert.  Strength 5 out of 5 in lower x-rays.  Skin: Skin is warm and dry. Capillary refill takes less than 2 seconds. No pallor.  Psychiatric: Her behavior is normal. Judgment and thought content normal.  Nursing note and vitals reviewed.    ED Treatments / Results  Labs (all labs ordered are listed, but only abnormal results are displayed) Labs Reviewed - No data to display  EKG  EKG Interpretation None  Radiology Dg Knee Complete 4 Views Right  Result Date: 11/22/2016 CLINICAL DATA:  No known injury.  Pain anteriorly over the last day. EXAM: RIGHT KNEE - COMPLETE 4+ VIEW COMPARISON:  None. FINDINGS: No joint space narrowing. No osteophytes. No focal lesion. No effusion. IMPRESSION: Normal radiographs. Electronically Signed   By: Paulina FusiMark  Shogry M.D.   On: 11/22/2016 12:53    Procedures Procedures (including critical care time)  Medications Ordered in ED Medications  naproxen (NAPROSYN) tablet 375 mg (375  mg Oral Given 11/22/16 1431)     Initial Impression / Assessment and Plan / ED Course  I have reviewed the triage vital signs and the nursing notes.  Pertinent labs & imaging results that were available during my care of the patient were reviewed by me and considered in my medical decision making (see chart for details).     Patient resents to the ED with complaints of right knee pain. Patient with morbid obesity. Patient is neurovascularly intact. No signs of lower extremity edema or calf tenderness that would be concerning for DVT. Joint is without any erythema. Doubt septic arthritis. Patient X-Ray negative for obvious fracture or dislocation. Pain managed in ED. Pt advised to follow up with orthopedics if symptoms persist for possibility of missed fracture diagnosis. Patient given brace while in ED, conservative therapy recommended and discussed. Patient will be dc home & is agreeable with above plan.   Final Clinical Impressions(s) / ED Diagnoses   Final diagnoses:  Acute pain of right knee    New Prescriptions New Prescriptions   NAPROXEN (NAPROSYN) 375 MG TABLET    Take 1 tablet (375 mg total) by mouth 2 (two) times daily.     Rise MuLeaphart, Kenneth T, [name] 11/22/16 1735    Tegeler, Canary Brimhristopher J, MD 11/23/16 [phone]

## 2016-11-23 DIAGNOSIS — M25561 Pain in right knee: Secondary | ICD-10-CM | POA: Diagnosis not present

## 2016-11-23 DIAGNOSIS — M7651 Patellar tendinitis, right knee: Secondary | ICD-10-CM | POA: Diagnosis not present

## 2017-03-03 DIAGNOSIS — J069 Acute upper respiratory infection, unspecified: Secondary | ICD-10-CM | POA: Diagnosis not present

## 2017-03-03 DIAGNOSIS — J4520 Mild intermittent asthma, uncomplicated: Secondary | ICD-10-CM | POA: Diagnosis not present

## 2017-04-09 ENCOUNTER — Encounter (HOSPITAL_BASED_OUTPATIENT_CLINIC_OR_DEPARTMENT_OTHER): Payer: Self-pay | Admitting: Emergency Medicine

## 2017-04-09 ENCOUNTER — Other Ambulatory Visit: Payer: Self-pay

## 2017-04-09 ENCOUNTER — Emergency Department (HOSPITAL_BASED_OUTPATIENT_CLINIC_OR_DEPARTMENT_OTHER): Payer: BLUE CROSS/BLUE SHIELD

## 2017-04-09 ENCOUNTER — Emergency Department (HOSPITAL_BASED_OUTPATIENT_CLINIC_OR_DEPARTMENT_OTHER)
Admission: EM | Admit: 2017-04-09 | Discharge: 2017-04-09 | Disposition: A | Discharge status: Home / Self Care | Payer: BLUE CROSS/BLUE SHIELD | Attending: Emergency Medicine | Admitting: Emergency Medicine

## 2017-04-09 DIAGNOSIS — J45909 Unspecified asthma, uncomplicated: Secondary | ICD-10-CM | POA: Diagnosis not present

## 2017-04-09 DIAGNOSIS — Z79899 Other long term (current) drug therapy: Secondary | ICD-10-CM | POA: Diagnosis not present

## 2017-04-09 DIAGNOSIS — Z87891 Personal history of nicotine dependence: Secondary | ICD-10-CM | POA: Insufficient documentation

## 2017-04-09 DIAGNOSIS — J069 Acute upper respiratory infection, unspecified: Secondary | ICD-10-CM | POA: Diagnosis not present

## 2017-04-09 DIAGNOSIS — J309 Allergic rhinitis, unspecified: Secondary | ICD-10-CM | POA: Insufficient documentation

## 2017-04-09 DIAGNOSIS — R05 Cough: Secondary | ICD-10-CM | POA: Diagnosis not present

## 2017-04-09 DIAGNOSIS — B9789 Other viral agents as the cause of diseases classified elsewhere: Secondary | ICD-10-CM | POA: Diagnosis not present

## 2017-04-09 MED ORDER — CETIRIZINE HCL 10 MG PO TABS: 10.0000 mg | ORAL_TABLET | Freq: Every day | ORAL | 1 refills | Status: DC

## 2017-04-09 MED ORDER — FLUTICASONE PROPIONATE 50 MCG/ACT NA SUSP: 2.0000 | Freq: Every day | NASAL | 0 refills | Status: DC

## 2017-04-09 MED ORDER — BENZONATATE 100 MG PO CAPS: 100.0000 mg | ORAL_CAPSULE | Freq: Three times a day (TID) | ORAL | 0 refills | Status: DC | PRN

## 2017-04-09 MED ORDER — NAPROXEN 250 MG PO TABS: 250.0000 mg | ORAL_TABLET | Freq: Two times a day (BID) | ORAL | 0 refills | Status: DC

## 2017-04-09 NOTE — ED Notes (Addendum)
EDPA into room, prior to RN assessment, see PA notes, orders received to d/c. Care assumed at time of d/c. Pt not seen by this RN. Pt d/c'd by other RN.

## 2017-04-09 NOTE — ED Notes (Signed)
Pt given d/c instructions as per chart. Rx x 4. Verbalizes understanding. No questions. 

## 2017-04-09 NOTE — ED Triage Notes (Signed)
Productive cough with green sputum x 1 week.  

## 2017-04-09 NOTE — ED Provider Notes (Signed)
[facility] EMERGENCY DEPARTMENT Provider Note   CSN: [id number] Arrival date & time: 04/09/17  1722     History   Chief Complaint Chief Complaint  Patient presents with  . Cough    HPI Bailey Villarreal is a 31 y.o. female.  Bailey Villarreal is a 31 y.o. Female who presents to the emergency department complaining of 5 days of a cough, sneezing, nasal congestion, and postnasal drip.  Patient reports her symptoms began about 5 days ago and have been worsening with her cough.  She denies any wheezing, or shortness of breath.  She has a history of asthma, but has not needed to use her inhaler in the past 5 days.  She denies having any fevers.  No treatments attempted prior to arrival today.  She took NyQuil a couple days ago with little relief.  She reports her cough is productive.  She reports pain in her chest when she coughs.  She is not a smoker.  She denies fevers, shortness of breath, wheezing, abdominal pain, nausea, vomiting, lightheadedness, dizziness, or syncope.    The history is provided by the patient and medical records. No language interpreter was used.  Cough  Associated symptoms include ear pain and rhinorrhea. Pertinent negatives include no chest pain, no chills, no headaches, no sore throat, no myalgias, no shortness of breath and no wheezing.    Past Medical History:  Diagnosis Date  . Asthma   . Morbid obesity (HCC)   . Transient ischemic attack (TIA)   . Venous stasis     There are no active problems to display for this patient.   History reviewed. No pertinent surgical history.  OB History    No data available       Home Medications    Prior to Admission medications   Medication Sig Start Date End Date Taking? Authorizing Provider  benzonatate (TESSALON) 100 MG capsule Take 1 capsule (100 mg total) by mouth 3 (three) times daily as needed for cough. 04/09/17   [name], Liboria Putnam, [name]  cetirizine (ZYRTEC ALLERGY) 10 MG tablet Take 1 tablet (10 mg  total) by mouth daily. 04/09/17   [name], Carolanne Mercier, [name]  fluticasone (FLONASE) 50 MCG/ACT nasal spray Place 2 sprays into both nostrils daily. 04/09/17   [name], Teddrick Mallari, [name]  naproxen (NAPROSYN) 250 MG tablet Take 1 tablet (250 mg total) by mouth 2 (two) times daily with a meal. 04/09/17   [name], Zanyla Klebba, [name]    Family History No family history on file.  Social History Social History   Tobacco Use  . Smoking status: Former Games developermoker  . Smokeless tobacco: Never Used  Substance Use Topics  . Alcohol use: No  . Drug use: No     Allergies   Patient has no known allergies.   Review of Systems Review of Systems  Constitutional: Negative for chills and fever.  HENT: Positive for congestion, ear pain, postnasal drip, rhinorrhea and sneezing. Negative for ear discharge, sore throat and trouble swallowing.   Eyes: Negative for visual disturbance.  Respiratory: Positive for cough. Negative for shortness of breath and wheezing.   Cardiovascular: Negative for chest pain and palpitations.  Gastrointestinal: Negative for abdominal pain, diarrhea, nausea and vomiting.  Genitourinary: Negative for dysuria.  Musculoskeletal: Negative for back pain, myalgias and neck pain.  Skin: Negative for rash.  Neurological: Negative for light-headedness and headaches.     Physical Exam Updated Vital Signs BP (!) 162/79 (BP Location: Left Arm)   Pulse 87   Temp  98.5 F (36.9 C) (Oral)   Resp 18   Ht 5\' 11"  (1.803 m)   Wt (!) 192.8 kg (425 lb)   LMP 03/15/2017   SpO2 100%   BMI 59.28 kg/m   Physical Exam  Constitutional: She appears well-developed and well-nourished. No distress.  Nontoxic appearing.  Morbidly obese female.  HENT:  Head: Normocephalic and atraumatic.  Mouth/Throat: Oropharynx is clear and moist.  Boggy nasal turbinates and rhinorrhea present bilaterally. Mild clear middle ear effusion noted bilaterally without TM erythema or loss of landmarks. No tonsillar hypertrophy  or exudates.  Evidence of postnasal drip in her posterior oropharynx.  Eyes: Conjunctivae are normal. Pupils are equal, round, and reactive to light. Right eye exhibits no discharge. Left eye exhibits no discharge.  Neck: Normal range of motion. Neck supple.  Cardiovascular: Normal rate, regular rhythm, normal heart sounds and intact distal pulses. Exam reveals no gallop and no friction rub.  No murmur heard. Pulmonary/Chest: Effort normal and breath sounds normal. No stridor. No respiratory distress. She has no wheezes. She has no rales. She exhibits tenderness.  Lungs are clear to ascultation bilaterally. Symmetric chest expansion bilaterally. No increased work of breathing. No rales or rhonchi.   Chest wall is tender to palpation.  Abdominal: Soft. There is no tenderness.  Musculoskeletal: She exhibits no edema.  Lymphadenopathy:    She has no cervical adenopathy.  Neurological: She is alert. Coordination normal.  Skin: Skin is warm and dry. No rash noted. She is not diaphoretic. No erythema. No pallor.  Psychiatric: She has a normal mood and affect. Her behavior is normal.  Nursing note and vitals reviewed.    ED Treatments / Results  Labs (all labs ordered are listed, but only abnormal results are displayed) Labs Reviewed - No data to display  EKG  EKG Interpretation None       Radiology Dg Chest 2 View  Result Date: 04/09/2017 CLINICAL DATA:  31 year old female with cough, congestion and body aches for 1 week. EXAM: CHEST  2 VIEW COMPARISON:  04/27/2016. FINDINGS: The heart size and mediastinal contours are enlarged but stable. There are low lung volumes with bronchovascular crowding. Both lungs are clear. The visualized skeletal structures are unremarkable. IMPRESSION: Stable cardiomegaly without acute intrathoracic pathology. Electronically Signed   By: Sande Brothers M.D.   On: 04/09/2017 18:13    Procedures Procedures (including critical care time)  Medications  Ordered in ED Medications - No data to display   Initial Impression / Assessment and Plan / ED Course  I have reviewed the triage vital signs and the nursing notes.  Pertinent labs & imaging results that were available during my care of the patient were reviewed by me and considered in my medical decision making (see chart for details).    This  is a 31 y.o. Female who presents to the emergency department complaining of 5 days of a cough, sneezing, nasal congestion, and postnasal drip.  Patient reports her symptoms began about 5 days ago and have been worsening with her cough.  She denies any wheezing, or shortness of breath.  She has a history of asthma, but has not needed to use her inhaler in the past 5 days.  She denies having any fevers.  No treatments attempted prior to arrival today.  She took NyQuil a couple days ago with little relief.  She reports her cough is productive.  She reports pain in her chest when she coughs.  She is not a smoker. On  exam the patient is afebrile nontoxic appearing.  Her lungs are clear to auscultation bilaterally.  Chest x-ray is unremarkable.  She has upper respiratory infection with postnasal drip.  This is the likely culprit of her cough.  We will start the patient on Flonase, Zyrtec, naproxen and Tessalon Perles.  I discussed strict and specific return precautions. I advised the patient to follow-up with their primary care provider this week. I advised the patient to return to the emergency department with new or worsening symptoms or new concerns. The patient verbalized understanding and agreement with plan.    Final Clinical Impressions(s) / ED Diagnoses   Final diagnoses:  Viral URI with cough  Allergic rhinitis, unspecified seasonality, unspecified trigger    ED Discharge Orders        Ordered    fluticasone (FLONASE) 50 MCG/ACT nasal spray  Daily     04/09/17 2013    cetirizine (ZYRTEC ALLERGY) 10 MG tablet  Daily     04/09/17 2013     benzonatate (TESSALON) 100 MG capsule  3 times daily PRN     04/09/17 2013    naproxen (NAPROSYN) 250 MG tablet  2 times daily with meals     04/09/17 2013       Everlene FarrierDansie, Abaigeal Moomaw, [name] 04/09/17 2020    Long, Arlyss RepressJoshua G, MD 04/10/17 1201

## 2017-05-19 DIAGNOSIS — J45901 Unspecified asthma with (acute) exacerbation: Secondary | ICD-10-CM | POA: Diagnosis not present

## 2017-05-19 DIAGNOSIS — J069 Acute upper respiratory infection, unspecified: Secondary | ICD-10-CM | POA: Diagnosis not present

## 2017-06-15 DIAGNOSIS — Z6841 Body Mass Index (BMI) 40.0 and over, adult: Secondary | ICD-10-CM | POA: Diagnosis not present

## 2017-06-15 DIAGNOSIS — I878 Other specified disorders of veins: Secondary | ICD-10-CM | POA: Diagnosis not present

## 2017-07-06 DIAGNOSIS — Z6841 Body Mass Index (BMI) 40.0 and over, adult: Secondary | ICD-10-CM | POA: Diagnosis not present

## 2017-07-20 DIAGNOSIS — Z6841 Body Mass Index (BMI) 40.0 and over, adult: Secondary | ICD-10-CM | POA: Diagnosis not present

## 2017-07-20 DIAGNOSIS — Z713 Dietary counseling and surveillance: Secondary | ICD-10-CM | POA: Diagnosis not present

## 2017-07-25 DIAGNOSIS — Z6841 Body Mass Index (BMI) 40.0 and over, adult: Secondary | ICD-10-CM | POA: Diagnosis not present

## 2017-07-25 DIAGNOSIS — Z131 Encounter for screening for diabetes mellitus: Secondary | ICD-10-CM | POA: Diagnosis not present

## 2017-07-25 DIAGNOSIS — Z136 Encounter for screening for cardiovascular disorders: Secondary | ICD-10-CM | POA: Diagnosis not present

## 2017-07-25 DIAGNOSIS — Z13220 Encounter for screening for lipoid disorders: Secondary | ICD-10-CM | POA: Diagnosis not present

## 2017-08-11 DIAGNOSIS — J321 Chronic frontal sinusitis: Secondary | ICD-10-CM | POA: Diagnosis not present

## 2017-08-11 DIAGNOSIS — J45901 Unspecified asthma with (acute) exacerbation: Secondary | ICD-10-CM | POA: Diagnosis not present

## 2017-08-17 DIAGNOSIS — Z6841 Body Mass Index (BMI) 40.0 and over, adult: Secondary | ICD-10-CM | POA: Diagnosis not present

## 2017-09-06 DIAGNOSIS — Z131 Encounter for screening for diabetes mellitus: Secondary | ICD-10-CM | POA: Diagnosis not present

## 2017-09-06 DIAGNOSIS — Z6841 Body Mass Index (BMI) 40.0 and over, adult: Secondary | ICD-10-CM | POA: Diagnosis not present

## 2017-09-06 DIAGNOSIS — Z13220 Encounter for screening for lipoid disorders: Secondary | ICD-10-CM | POA: Diagnosis not present

## 2017-09-06 DIAGNOSIS — R030 Elevated blood-pressure reading, without diagnosis of hypertension: Secondary | ICD-10-CM | POA: Diagnosis not present

## 2017-09-18 DIAGNOSIS — R609 Edema, unspecified: Secondary | ICD-10-CM | POA: Diagnosis not present

## 2017-09-18 DIAGNOSIS — I10 Essential (primary) hypertension: Secondary | ICD-10-CM | POA: Diagnosis present

## 2017-09-18 DIAGNOSIS — D509 Iron deficiency anemia, unspecified: Secondary | ICD-10-CM | POA: Diagnosis not present

## 2017-09-21 DIAGNOSIS — Z6841 Body Mass Index (BMI) 40.0 and over, adult: Secondary | ICD-10-CM | POA: Diagnosis not present

## 2017-09-21 DIAGNOSIS — Z713 Dietary counseling and surveillance: Secondary | ICD-10-CM | POA: Diagnosis not present

## 2017-10-16 DIAGNOSIS — Z7189 Other specified counseling: Secondary | ICD-10-CM | POA: Diagnosis not present

## 2017-10-16 DIAGNOSIS — Z6841 Body Mass Index (BMI) 40.0 and over, adult: Secondary | ICD-10-CM | POA: Diagnosis not present

## 2017-10-16 DIAGNOSIS — F54 Psychological and behavioral factors associated with disorders or diseases classified elsewhere: Secondary | ICD-10-CM | POA: Diagnosis not present

## 2017-10-17 DIAGNOSIS — I10 Essential (primary) hypertension: Secondary | ICD-10-CM | POA: Diagnosis not present

## 2017-10-17 DIAGNOSIS — Z6841 Body Mass Index (BMI) 40.0 and over, adult: Secondary | ICD-10-CM | POA: Diagnosis not present

## 2017-10-26 DIAGNOSIS — Z6841 Body Mass Index (BMI) 40.0 and over, adult: Secondary | ICD-10-CM | POA: Diagnosis not present

## 2017-10-26 DIAGNOSIS — I10 Essential (primary) hypertension: Secondary | ICD-10-CM | POA: Diagnosis not present

## 2017-11-21 DIAGNOSIS — S99912A Unspecified injury of left ankle, initial encounter: Secondary | ICD-10-CM | POA: Diagnosis not present

## 2017-11-21 DIAGNOSIS — I10 Essential (primary) hypertension: Secondary | ICD-10-CM | POA: Diagnosis not present

## 2017-12-12 DIAGNOSIS — I10 Essential (primary) hypertension: Secondary | ICD-10-CM | POA: Diagnosis not present

## 2017-12-12 DIAGNOSIS — Z6841 Body Mass Index (BMI) 40.0 and over, adult: Secondary | ICD-10-CM | POA: Diagnosis not present

## 2017-12-13 DIAGNOSIS — Z6841 Body Mass Index (BMI) 40.0 and over, adult: Secondary | ICD-10-CM | POA: Diagnosis not present

## 2017-12-13 DIAGNOSIS — Z713 Dietary counseling and surveillance: Secondary | ICD-10-CM | POA: Diagnosis not present

## 2017-12-21 DIAGNOSIS — Z6841 Body Mass Index (BMI) 40.0 and over, adult: Secondary | ICD-10-CM | POA: Diagnosis not present

## 2017-12-21 DIAGNOSIS — Z713 Dietary counseling and surveillance: Secondary | ICD-10-CM | POA: Diagnosis not present

## 2017-12-25 DIAGNOSIS — Z6841 Body Mass Index (BMI) 40.0 and over, adult: Secondary | ICD-10-CM | POA: Diagnosis not present

## 2017-12-25 DIAGNOSIS — I878 Other specified disorders of veins: Secondary | ICD-10-CM | POA: Diagnosis not present

## 2017-12-25 DIAGNOSIS — I10 Essential (primary) hypertension: Secondary | ICD-10-CM | POA: Diagnosis not present

## 2018-01-09 DIAGNOSIS — I10 Essential (primary) hypertension: Secondary | ICD-10-CM | POA: Diagnosis not present

## 2018-01-09 DIAGNOSIS — I878 Other specified disorders of veins: Secondary | ICD-10-CM | POA: Diagnosis not present

## 2018-01-16 DIAGNOSIS — Z01818 Encounter for other preprocedural examination: Secondary | ICD-10-CM | POA: Diagnosis not present

## 2018-01-29 DIAGNOSIS — D509 Iron deficiency anemia, unspecified: Secondary | ICD-10-CM | POA: Diagnosis not present

## 2018-01-29 DIAGNOSIS — Z6841 Body Mass Index (BMI) 40.0 and over, adult: Secondary | ICD-10-CM | POA: Diagnosis not present

## 2018-01-29 DIAGNOSIS — E8881 Metabolic syndrome: Secondary | ICD-10-CM | POA: Diagnosis not present

## 2018-01-29 DIAGNOSIS — J45909 Unspecified asthma, uncomplicated: Secondary | ICD-10-CM | POA: Diagnosis not present

## 2018-01-29 DIAGNOSIS — I10 Essential (primary) hypertension: Secondary | ICD-10-CM | POA: Diagnosis not present

## 2018-01-29 DIAGNOSIS — I878 Other specified disorders of veins: Secondary | ICD-10-CM | POA: Diagnosis not present

## 2018-02-14 DIAGNOSIS — Z6841 Body Mass Index (BMI) 40.0 and over, adult: Secondary | ICD-10-CM | POA: Diagnosis not present

## 2018-02-14 DIAGNOSIS — Z713 Dietary counseling and surveillance: Secondary | ICD-10-CM | POA: Diagnosis not present

## 2018-05-03 DIAGNOSIS — Z6841 Body Mass Index (BMI) 40.0 and over, adult: Secondary | ICD-10-CM | POA: Diagnosis not present

## 2018-05-03 DIAGNOSIS — Z9884 Bariatric surgery status: Secondary | ICD-10-CM | POA: Diagnosis not present

## 2018-05-03 DIAGNOSIS — Z713 Dietary counseling and surveillance: Secondary | ICD-10-CM | POA: Diagnosis not present

## 2018-05-16 ENCOUNTER — Encounter (HOSPITAL_BASED_OUTPATIENT_CLINIC_OR_DEPARTMENT_OTHER): Payer: Self-pay | Admitting: *Deleted

## 2018-05-16 ENCOUNTER — Emergency Department (HOSPITAL_BASED_OUTPATIENT_CLINIC_OR_DEPARTMENT_OTHER)
Admission: EM | Admit: 2018-05-16 | Discharge: 2018-05-16 | Disposition: A | Discharge status: Home / Self Care | Payer: BLUE CROSS/BLUE SHIELD | Attending: Emergency Medicine | Admitting: Emergency Medicine

## 2018-05-16 ENCOUNTER — Other Ambulatory Visit: Payer: Self-pay

## 2018-05-16 DIAGNOSIS — J45909 Unspecified asthma, uncomplicated: Secondary | ICD-10-CM | POA: Diagnosis not present

## 2018-05-16 DIAGNOSIS — R112 Nausea with vomiting, unspecified: Secondary | ICD-10-CM | POA: Diagnosis not present

## 2018-05-16 DIAGNOSIS — Z87891 Personal history of nicotine dependence: Secondary | ICD-10-CM | POA: Insufficient documentation

## 2018-05-16 DIAGNOSIS — R109 Unspecified abdominal pain: Secondary | ICD-10-CM | POA: Diagnosis not present

## 2018-05-16 LAB — CBC WITH DIFFERENTIAL/PLATELET
Abs Immature Granulocytes: 0.02 10*3/uL (ref 0.00–0.07)
Basophils Absolute: 0 10*3/uL (ref 0.0–0.1)
Basophils Relative: 0 %
Eosinophils Absolute: 0.1 10*3/uL (ref 0.0–0.5)
Eosinophils Relative: 1 %
HCT: 33.5 % — ABNORMAL LOW (ref 36.0–46.0)
Hemoglobin: 9.6 g/dL — ABNORMAL LOW (ref 12.0–15.0)
Immature Granulocytes: 0 %
Lymphocytes Relative: 20 %
Lymphs Abs: 2 10*3/uL (ref 0.7–4.0)
MCH: 23.7 pg — ABNORMAL LOW (ref 26.0–34.0)
MCHC: 28.7 g/dL — ABNORMAL LOW (ref 30.0–36.0)
MCV: 82.7 fL (ref 80.0–100.0)
Monocytes Absolute: 0.6 10*3/uL (ref 0.1–1.0)
Monocytes Relative: 7 %
Neutro Abs: 6.9 10*3/uL (ref 1.7–7.7)
Neutrophils Relative %: 72 %
Platelets: 193 10*3/uL (ref 150–400)
RBC: 4.05 MIL/uL (ref 3.87–5.11)
RDW: 16.4 % — ABNORMAL HIGH (ref 11.5–15.5)
WBC: 9.6 10*3/uL (ref 4.0–10.5)
nRBC: 0 % (ref 0.0–0.2)

## 2018-05-16 LAB — COMPREHENSIVE METABOLIC PANEL
ALT: 8 U/L (ref 0–44)
AST: 12 U/L — ABNORMAL LOW (ref 15–41)
Albumin: 3.7 g/dL (ref 3.5–5.0)
Alkaline Phosphatase: 75 U/L (ref 38–126)
Anion gap: 8 (ref 5–15)
BUN: 10 mg/dL (ref 6–20)
CO2: 25 mmol/L (ref 22–32)
Calcium: 9 mg/dL (ref 8.9–10.3)
Chloride: 104 mmol/L (ref 98–111)
Creatinine, Ser: 0.67 mg/dL (ref 0.44–1.00)
GFR calc Af Amer: >60 mL/min (ref >60)
GFR calc non Af Amer: >60 mL/min (ref >60)
Glucose, Bld: 95 mg/dL (ref 70–99)
Potassium: 3.3 mmol/L — ABNORMAL LOW (ref 3.5–5.1)
Sodium: 137 mmol/L (ref 135–145)
Total Bilirubin: 0.3 mg/dL (ref 0.3–1.2)
Total Protein: 7.5 g/dL (ref 6.5–8.1)

## 2018-05-16 LAB — URINALYSIS, MICROSCOPIC (REFLEX)

## 2018-05-16 LAB — URINALYSIS, ROUTINE W REFLEX MICROSCOPIC
Bilirubin Urine: NEGATIVE
Glucose, UA: NEGATIVE mg/dL
Hgb urine dipstick: NEGATIVE
Ketones, ur: 15 mg/dL — AB
Leukocytes, UA: NEGATIVE
Nitrite: NEGATIVE
Protein, ur: 30 mg/dL — AB
Specific Gravity, Urine: >1.03 — ABNORMAL HIGH (ref 1.005–1.030)
pH: 5.5 (ref 5.0–8.0)

## 2018-05-16 LAB — LIPASE, BLOOD: Lipase: 27 U/L (ref 11–51)

## 2018-05-16 LAB — PREGNANCY, URINE: Preg Test, Ur: NEGATIVE

## 2018-05-16 MED ORDER — ONDANSETRON HCL 4 MG/2ML IJ SOLN
4.0000 mg | Freq: Once | INTRAMUSCULAR | Status: AC | PRN
  Administered 2018-05-16: 4 mg via INTRAVENOUS
  Filled 2018-05-16: qty 2

## 2018-05-16 MED ORDER — SODIUM CHLORIDE 0.9 % IV BOLUS
1000.0000 mL | Freq: Once | INTRAVENOUS | Status: AC
Start: 2018-05-16 — End: 2018-05-16
  Administered 2018-05-16: 1000 mL via INTRAVENOUS

## 2018-05-16 MED ORDER — ONDANSETRON 8 MG PO TBDP: 8.0000 mg | ORAL_TABLET | Freq: Three times a day (TID) | ORAL | 0 refills | Status: DC | PRN

## 2018-05-16 NOTE — ED Triage Notes (Signed)
N/V x 4 hours.  Denies diarrhea, denies fever.

## 2018-05-16 NOTE — ED Provider Notes (Signed)
[facility] Provider Note: Bailey Villarreal Bailey Twitty, MD, FACEP  CSN: [id number] MRN: [id number] ARRIVAL: 05/16/18 at 0245 ROOM: [facility]02/[facility]02   CHIEF COMPLAINT  Vomiting   HISTORY OF PRESENT ILLNESS  05/16/18 3:26 AM Bailey Villarreal is a 33 y.o. female with nausea and vomiting for about 4 hours.  She has had associated intermittent abdominal cramping which she rates as a 6 out of 10.  She denies fever or diarrhea.  She estimates she is vomited 6 times.   Past Medical History:  Diagnosis Date  . Asthma   . Morbid obesity (HCC)   . Transient ischemic attack (TIA)   . Venous stasis     Past Surgical History:  Procedure Laterality Date  . LAPAROSCOPIC GASTRIC SLEEVE RESECTION      History reviewed. No pertinent family history.  Social History   Tobacco Use  . Smoking status: Former Games developermoker  . Smokeless tobacco: Never Used  Substance Use Topics  . Alcohol use: No  . Drug use: No    Prior to Admission medications   Medication Sig Start Date End Date Taking? Authorizing Provider  ferrous sulfate 325 (65 FE) MG tablet TAKE 1 TABLET BY MOUTH EVERY DAY 02/19/18  Yes [provider]  ursodiol (ACTIGALL) 300 MG capsule Take one tab twice a day with food BEGINNING 14 DAYS AFTER SURGERY 01/23/18  Yes [provider]  naproxen (NAPROSYN) 250 MG tablet Take 1 tablet (250 mg total) by mouth 2 (two) times daily with a meal. 04/09/17   Bailey Villarreal, [name], [name]  promethazine (PHENERGAN) 25 MG tablet Take 1 tablet (25 mg total) by mouth every 6 (six) hours as needed for nausea. 10/03/11 05/26/12  Bailey Villarreal, [name], MD    Allergies Patient has no known allergies.   REVIEW OF SYSTEMS  Negative except as noted here or in the History of Present Illness.   PHYSICAL EXAMINATION  Initial Vital Signs Blood pressure (!) 165/71, pulse 85, temperature 98.1 F (36.7 C), temperature source Oral, resp. rate 18, height 5\' 10"  (1.778 m), weight (!) 150.1 kg, last menstrual period 04/18/2018,  SpO2 100 %.  Examination General: Well-developed, well-nourished female in no acute distress; appearance consistent with age of record HENT: normocephalic; atraumatic Eyes: pupils equal, round and reactive to light; extraocular muscles intact Neck: supple Heart: regular rate and rhythm Lungs: clear to auscultation bilaterally Abdomen: soft; nondistended; mild diffuse tenderness; bowel sounds present Extremities: No deformity; full range of motion; pulses normal Neurologic: Awake, alert and oriented; motor function intact in all extremities and symmetric; no facial droop Skin: Warm and dry; chronic appearing stasis changes of lower legs Psychiatric: Flat affect   RESULTS  Summary of this visit's results, reviewed by myself:   EKG Interpretation  Date/Time:    Ventricular Rate:    PR Interval:    QRS Duration:   QT Interval:    QTC Calculation:   R Axis:     Text Interpretation:        Laboratory Studies: Results for orders placed or performed during the hospital encounter of 05/16/18 (from the past 24 hour(s))  Lipase, blood     Status: None   Collection Time: 05/16/18  3:29 AM  Result Value Ref Range   Lipase 27 11 - 51 U/L  Comprehensive metabolic panel     Status: Abnormal   Collection Time: 05/16/18  3:29 AM  Result Value Ref Range   Sodium 137 135 - 145 mmol/L   Potassium 3.3 (L) 3.5 - 5.1 mmol/L  Chloride 104 98 - 111 mmol/L   CO2 25 22 - 32 mmol/L   Glucose, Bld 95 70 - 99 mg/dL   BUN 10 6 - 20 mg/dL   Creatinine, Ser 2.87 0.44 - 1.00 mg/dL   Calcium 9.0 8.9 - 86.7 mg/dL   Total Protein 7.5 6.5 - 8.1 g/dL   Albumin 3.7 3.5 - 5.0 g/dL   AST 12 (L) 15 - 41 U/L   ALT 8 0 - 44 U/L   Alkaline Phosphatase 75 38 - 126 U/L   Total Bilirubin 0.3 0.3 - 1.2 mg/dL   GFR calc non Af Amer >60 >60 mL/min   GFR calc Af Amer >60 >60 mL/min   Anion gap 8 5 - 15  Urinalysis, Routine w reflex microscopic     Status: Abnormal   Collection Time: 05/16/18  3:29 AM  Result  Value Ref Range   Color, Urine YELLOW YELLOW   APPearance CLEAR CLEAR   Specific Gravity, Urine >1.030 (H) 1.005 - 1.030   pH 5.5 5.0 - 8.0   Glucose, UA NEGATIVE NEGATIVE mg/dL   Hgb urine dipstick NEGATIVE NEGATIVE   Bilirubin Urine NEGATIVE NEGATIVE   Ketones, ur 15 (A) NEGATIVE mg/dL   Protein, ur 30 (A) NEGATIVE mg/dL   Nitrite NEGATIVE NEGATIVE   Leukocytes, UA NEGATIVE NEGATIVE  Pregnancy, urine     Status: None   Collection Time: 05/16/18  3:29 AM  Result Value Ref Range   Preg Test, Ur NEGATIVE NEGATIVE  CBC with Differential     Status: Abnormal   Collection Time: 05/16/18  3:29 AM  Result Value Ref Range   WBC 9.6 4.0 - 10.5 K/uL   RBC 4.05 3.87 - 5.11 MIL/uL   Hemoglobin 9.6 (L) 12.0 - 15.0 g/dL   HCT 67.2 (L) 09.4 - 70.9 %   MCV 82.7 80.0 - 100.0 fL   MCH 23.7 (L) 26.0 - 34.0 pg   MCHC 28.7 (L) 30.0 - 36.0 g/dL   RDW 62.8 (H) 36.6 - 29.4 %   Platelets 193 150 - 400 K/uL   nRBC 0.0 0.0 - 0.2 %   Neutrophils Relative % 72 %   Neutro Abs 6.9 1.7 - 7.7 K/uL   Lymphocytes Relative 20 %   Lymphs Abs 2.0 0.7 - 4.0 K/uL   Monocytes Relative 7 %   Monocytes Absolute 0.6 0.1 - 1.0 K/uL   Eosinophils Relative 1 %   Eosinophils Absolute 0.1 0.0 - 0.5 K/uL   Basophils Relative 0 %   Basophils Absolute 0.0 0.0 - 0.1 K/uL   Immature Granulocytes 0 %   Abs Immature Granulocytes 0.02 0.00 - 0.07 K/uL  Urinalysis, Microscopic (reflex)     Status: Abnormal   Collection Time: 05/16/18  3:29 AM  Result Value Ref Range   RBC / HPF 0-5 0 - 5 RBC/hpf   WBC, UA 0-5 0 - 5 WBC/hpf   Bacteria, UA MANY (A) NONE SEEN   Squamous Epithelial / LPF 6-10 0 - 5   Mucus PRESENT    Hyaline Casts, UA PRESENT    Imaging Studies: No results found.  ED COURSE and MDM  Nursing notes and initial vitals signs, including pulse oximetry, reviewed.  Vitals:   05/16/18 0256 05/16/18 0257  BP: (!) 165/71   Pulse: 85   Resp: 18   Temp: 98.1 F (36.7 C)   TempSrc: Oral   SpO2: 100%     Weight:  (!) 150.1 kg  Height:  5'  10" (1.778 m)   5:06 AM Sleeping peacefully after IV fluids and Zofran.  Has been drinking fluids without emesis.   PROCEDURES    ED DIAGNOSES     ICD-10-CM   1. Nausea and vomiting in adult R11.2        Jasha Hodzic, Jonny RuizJohn, MD 05/16/18 [phone]

## 2018-06-14 DIAGNOSIS — Z9884 Bariatric surgery status: Secondary | ICD-10-CM | POA: Diagnosis not present

## 2018-06-14 DIAGNOSIS — Z6841 Body Mass Index (BMI) 40.0 and over, adult: Secondary | ICD-10-CM | POA: Diagnosis not present

## 2018-06-14 DIAGNOSIS — Z713 Dietary counseling and surveillance: Secondary | ICD-10-CM | POA: Diagnosis not present

## 2018-07-26 DIAGNOSIS — K912 Postsurgical malabsorption, not elsewhere classified: Secondary | ICD-10-CM | POA: Diagnosis not present

## 2018-07-26 DIAGNOSIS — Z903 Acquired absence of stomach [part of]: Secondary | ICD-10-CM | POA: Diagnosis not present

## 2018-08-02 DIAGNOSIS — Z903 Acquired absence of stomach [part of]: Secondary | ICD-10-CM | POA: Diagnosis not present

## 2018-08-02 DIAGNOSIS — K912 Postsurgical malabsorption, not elsewhere classified: Secondary | ICD-10-CM | POA: Diagnosis not present

## 2018-10-18 DIAGNOSIS — Z760 Encounter for issue of repeat prescription: Secondary | ICD-10-CM | POA: Diagnosis not present

## 2018-10-18 DIAGNOSIS — G43909 Migraine, unspecified, not intractable, without status migrainosus: Secondary | ICD-10-CM | POA: Diagnosis not present

## 2018-10-18 DIAGNOSIS — J4520 Mild intermittent asthma, uncomplicated: Secondary | ICD-10-CM | POA: Diagnosis not present

## 2019-01-09 DIAGNOSIS — Z903 Acquired absence of stomach [part of]: Secondary | ICD-10-CM | POA: Diagnosis not present

## 2019-01-09 DIAGNOSIS — K912 Postsurgical malabsorption, not elsewhere classified: Secondary | ICD-10-CM | POA: Diagnosis not present

## 2019-03-07 DIAGNOSIS — Z03818 Encounter for observation for suspected exposure to other biological agents ruled out: Secondary | ICD-10-CM | POA: Diagnosis not present

## 2019-05-02 ENCOUNTER — Other Ambulatory Visit: Payer: Self-pay

## 2019-05-02 DIAGNOSIS — Z20822 Contact with and (suspected) exposure to covid-19: Secondary | ICD-10-CM

## 2019-05-02 DIAGNOSIS — Z20828 Contact with and (suspected) exposure to other viral communicable diseases: Secondary | ICD-10-CM | POA: Diagnosis not present

## 2019-05-03 ENCOUNTER — Other Ambulatory Visit: Payer: BLUE CROSS/BLUE SHIELD

## 2019-05-03 LAB — NOVEL CORONAVIRUS, NAA: SARS-CoV-2, NAA: NOT DETECTED

## 2019-05-13 DIAGNOSIS — R519 Headache, unspecified: Secondary | ICD-10-CM | POA: Diagnosis not present

## 2019-05-13 DIAGNOSIS — J45909 Unspecified asthma, uncomplicated: Secondary | ICD-10-CM | POA: Diagnosis not present

## 2019-05-13 DIAGNOSIS — I10 Essential (primary) hypertension: Secondary | ICD-10-CM | POA: Diagnosis not present

## 2019-05-13 DIAGNOSIS — R0789 Other chest pain: Secondary | ICD-10-CM | POA: Diagnosis not present

## 2019-05-13 DIAGNOSIS — R030 Elevated blood-pressure reading, without diagnosis of hypertension: Secondary | ICD-10-CM | POA: Diagnosis not present

## 2019-05-13 DIAGNOSIS — R079 Chest pain, unspecified: Secondary | ICD-10-CM | POA: Diagnosis not present

## 2019-05-16 DIAGNOSIS — R079 Chest pain, unspecified: Secondary | ICD-10-CM | POA: Diagnosis not present

## 2019-05-16 DIAGNOSIS — Z8673 Personal history of transient ischemic attack (TIA), and cerebral infarction without residual deficits: Secondary | ICD-10-CM | POA: Diagnosis not present

## 2019-05-16 DIAGNOSIS — R0789 Other chest pain: Secondary | ICD-10-CM | POA: Diagnosis not present

## 2019-05-16 DIAGNOSIS — R519 Headache, unspecified: Secondary | ICD-10-CM | POA: Diagnosis not present

## 2019-05-16 DIAGNOSIS — J45909 Unspecified asthma, uncomplicated: Secondary | ICD-10-CM | POA: Diagnosis not present

## 2019-05-16 DIAGNOSIS — I10 Essential (primary) hypertension: Secondary | ICD-10-CM | POA: Diagnosis not present

## 2019-05-16 DIAGNOSIS — G44209 Tension-type headache, unspecified, not intractable: Secondary | ICD-10-CM | POA: Diagnosis not present

## 2019-05-22 DIAGNOSIS — Z6841 Body Mass Index (BMI) 40.0 and over, adult: Secondary | ICD-10-CM | POA: Diagnosis not present

## 2019-05-22 DIAGNOSIS — R0683 Snoring: Secondary | ICD-10-CM | POA: Diagnosis not present

## 2019-05-22 DIAGNOSIS — I10 Essential (primary) hypertension: Secondary | ICD-10-CM | POA: Diagnosis not present

## 2019-06-03 DIAGNOSIS — M1711 Unilateral primary osteoarthritis, right knee: Secondary | ICD-10-CM | POA: Diagnosis not present

## 2019-06-03 DIAGNOSIS — M25562 Pain in left knee: Secondary | ICD-10-CM | POA: Diagnosis not present

## 2019-06-03 DIAGNOSIS — M25561 Pain in right knee: Secondary | ICD-10-CM | POA: Diagnosis not present

## 2019-06-14 ENCOUNTER — Other Ambulatory Visit (HOSPITAL_COMMUNITY)
Admission: RE | Admit: 2019-06-14 | Discharge: 2019-06-14 | Disposition: A | Discharge status: Home / Self Care | Payer: BC Managed Care – PPO | Source: Ambulatory Visit | Attending: Physician Assistant | Admitting: Physician Assistant

## 2019-06-14 ENCOUNTER — Other Ambulatory Visit: Payer: Self-pay | Admitting: Physician Assistant

## 2019-06-14 DIAGNOSIS — Z13220 Encounter for screening for lipoid disorders: Secondary | ICD-10-CM | POA: Diagnosis not present

## 2019-06-14 DIAGNOSIS — E559 Vitamin D deficiency, unspecified: Secondary | ICD-10-CM | POA: Diagnosis not present

## 2019-06-14 DIAGNOSIS — Z862 Personal history of diseases of the blood and blood-forming organs and certain disorders involving the immune mechanism: Secondary | ICD-10-CM | POA: Diagnosis not present

## 2019-06-14 DIAGNOSIS — R011 Cardiac murmur, unspecified: Secondary | ICD-10-CM | POA: Diagnosis not present

## 2019-06-14 DIAGNOSIS — Z9884 Bariatric surgery status: Secondary | ICD-10-CM | POA: Diagnosis not present

## 2019-06-14 DIAGNOSIS — Z124 Encounter for screening for malignant neoplasm of cervix: Secondary | ICD-10-CM | POA: Insufficient documentation

## 2019-06-14 DIAGNOSIS — I10 Essential (primary) hypertension: Secondary | ICD-10-CM | POA: Diagnosis not present

## 2019-06-14 DIAGNOSIS — Z0000 Encounter for general adult medical examination without abnormal findings: Secondary | ICD-10-CM | POA: Diagnosis not present

## 2019-06-20 LAB — CYTOLOGY - PAP: Diagnosis: NEGATIVE

## 2019-07-03 DIAGNOSIS — G478 Other sleep disorders: Secondary | ICD-10-CM | POA: Diagnosis not present

## 2019-07-03 DIAGNOSIS — G4721 Circadian rhythm sleep disorder, delayed sleep phase type: Secondary | ICD-10-CM | POA: Diagnosis not present

## 2019-07-03 DIAGNOSIS — R0683 Snoring: Secondary | ICD-10-CM | POA: Diagnosis not present

## 2019-07-03 DIAGNOSIS — R0681 Apnea, not elsewhere classified: Secondary | ICD-10-CM | POA: Diagnosis not present

## 2019-07-11 ENCOUNTER — Other Ambulatory Visit (HOSPITAL_BASED_OUTPATIENT_CLINIC_OR_DEPARTMENT_OTHER): Payer: Self-pay

## 2019-07-11 DIAGNOSIS — R5383 Other fatigue: Secondary | ICD-10-CM

## 2019-07-11 DIAGNOSIS — R0683 Snoring: Secondary | ICD-10-CM

## 2019-07-11 DIAGNOSIS — G4710 Hypersomnia, unspecified: Secondary | ICD-10-CM

## 2019-07-15 DIAGNOSIS — S61012A Laceration without foreign body of left thumb without damage to nail, initial encounter: Secondary | ICD-10-CM | POA: Diagnosis not present

## 2019-07-19 DIAGNOSIS — R0981 Nasal congestion: Secondary | ICD-10-CM | POA: Diagnosis not present

## 2019-07-19 DIAGNOSIS — Z9189 Other specified personal risk factors, not elsewhere classified: Secondary | ICD-10-CM | POA: Diagnosis not present

## 2019-07-19 DIAGNOSIS — J069 Acute upper respiratory infection, unspecified: Secondary | ICD-10-CM | POA: Diagnosis not present

## 2019-07-19 DIAGNOSIS — J040 Acute laryngitis: Secondary | ICD-10-CM | POA: Diagnosis not present

## 2019-08-08 ENCOUNTER — Other Ambulatory Visit (HOSPITAL_COMMUNITY): Payer: Self-pay

## 2019-08-10 ENCOUNTER — Encounter (HOSPITAL_BASED_OUTPATIENT_CLINIC_OR_DEPARTMENT_OTHER): Payer: Self-pay | Admitting: Internal Medicine

## 2019-08-29 ENCOUNTER — Other Ambulatory Visit (HOSPITAL_COMMUNITY): Payer: Self-pay

## 2019-08-31 ENCOUNTER — Ambulatory Visit (HOSPITAL_BASED_OUTPATIENT_CLINIC_OR_DEPARTMENT_OTHER): Payer: Self-pay | Attending: Internal Medicine | Admitting: Internal Medicine

## 2019-12-20 DIAGNOSIS — Z20828 Contact with and (suspected) exposure to other viral communicable diseases: Secondary | ICD-10-CM | POA: Diagnosis not present

## 2020-02-15 DIAGNOSIS — R55 Syncope and collapse: Secondary | ICD-10-CM | POA: Diagnosis not present

## 2020-02-15 DIAGNOSIS — R42 Dizziness and giddiness: Secondary | ICD-10-CM | POA: Diagnosis not present

## 2020-02-15 DIAGNOSIS — R0902 Hypoxemia: Secondary | ICD-10-CM | POA: Diagnosis not present

## 2020-02-15 DIAGNOSIS — T679XXA Effect of heat and light, unspecified, initial encounter: Secondary | ICD-10-CM | POA: Diagnosis not present

## 2020-02-15 DIAGNOSIS — R079 Chest pain, unspecified: Secondary | ICD-10-CM | POA: Diagnosis not present

## 2020-02-15 DIAGNOSIS — R0789 Other chest pain: Secondary | ICD-10-CM | POA: Diagnosis not present

## 2020-02-15 DIAGNOSIS — Z5321 Procedure and treatment not carried out due to patient leaving prior to being seen by health care provider: Secondary | ICD-10-CM | POA: Diagnosis not present

## 2020-02-16 DIAGNOSIS — R42 Dizziness and giddiness: Secondary | ICD-10-CM | POA: Diagnosis not present

## 2020-04-30 DIAGNOSIS — U071 COVID-19: Secondary | ICD-10-CM | POA: Diagnosis not present

## 2020-05-08 DIAGNOSIS — Z03818 Encounter for observation for suspected exposure to other biological agents ruled out: Secondary | ICD-10-CM | POA: Diagnosis not present

## 2020-05-08 DIAGNOSIS — U071 COVID-19: Secondary | ICD-10-CM | POA: Diagnosis not present

## 2020-05-15 DIAGNOSIS — Z03818 Encounter for observation for suspected exposure to other biological agents ruled out: Secondary | ICD-10-CM | POA: Diagnosis not present

## 2020-08-09 ENCOUNTER — Other Ambulatory Visit: Payer: Self-pay

## 2020-08-09 ENCOUNTER — Encounter (HOSPITAL_BASED_OUTPATIENT_CLINIC_OR_DEPARTMENT_OTHER): Payer: Self-pay | Admitting: Emergency Medicine

## 2020-08-09 ENCOUNTER — Emergency Department (HOSPITAL_BASED_OUTPATIENT_CLINIC_OR_DEPARTMENT_OTHER)
Admission: EM | Admit: 2020-08-09 | Discharge: 2020-08-09 | Disposition: A | Discharge status: Home / Self Care | Payer: BC Managed Care – PPO | Attending: Emergency Medicine | Admitting: Emergency Medicine

## 2020-08-09 ENCOUNTER — Emergency Department (HOSPITAL_BASED_OUTPATIENT_CLINIC_OR_DEPARTMENT_OTHER): Payer: BC Managed Care – PPO

## 2020-08-09 DIAGNOSIS — M79675 Pain in left toe(s): Secondary | ICD-10-CM | POA: Diagnosis not present

## 2020-08-09 DIAGNOSIS — M79672 Pain in left foot: Secondary | ICD-10-CM | POA: Diagnosis not present

## 2020-08-09 DIAGNOSIS — J45909 Unspecified asthma, uncomplicated: Secondary | ICD-10-CM | POA: Diagnosis not present

## 2020-08-09 DIAGNOSIS — M79642 Pain in left hand: Secondary | ICD-10-CM | POA: Diagnosis not present

## 2020-08-09 DIAGNOSIS — Y9301 Activity, walking, marching and hiking: Secondary | ICD-10-CM | POA: Insufficient documentation

## 2020-08-09 DIAGNOSIS — W1830XA Fall on same level, unspecified, initial encounter: Secondary | ICD-10-CM | POA: Insufficient documentation

## 2020-08-09 DIAGNOSIS — M7989 Other specified soft tissue disorders: Secondary | ICD-10-CM | POA: Diagnosis not present

## 2020-08-09 DIAGNOSIS — Z87891 Personal history of nicotine dependence: Secondary | ICD-10-CM | POA: Diagnosis not present

## 2020-08-09 DIAGNOSIS — M25572 Pain in left ankle and joints of left foot: Secondary | ICD-10-CM | POA: Diagnosis not present

## 2020-08-09 DIAGNOSIS — W19XXXA Unspecified fall, initial encounter: Secondary | ICD-10-CM

## 2020-08-09 DIAGNOSIS — M25542 Pain in joints of left hand: Secondary | ICD-10-CM | POA: Diagnosis not present

## 2020-08-09 MED ORDER — IBUPROFEN 400 MG PO TABS
600.0000 mg | ORAL_TABLET | Freq: Once | ORAL | Status: AC
  Administered 2020-08-09: 600 mg via ORAL
  Filled 2020-08-09: qty 1

## 2020-08-09 NOTE — ED Provider Notes (Signed)
[facility] EMERGENCY DEPARTMENT Provider Note   CSN: [id number] Arrival date & time: 08/09/20  2108     History Chief Complaint  Patient presents with  . Fall    Bailey Villarreal is a 35 y.o. female with a past medical history of morbid obesity, TIA, venous stasis, who presents today for evaluation after a fall.  She states that she has issues with her left knee and it will occasionally give out and gave out today while she was walking.  She states this caused her to fall to the ground.  She does not take any blood thinning medications, did not have loss of consciousness.  She reports pain in her left hand/small finger and her left foot primarily along the third, fourth, and small toes. She denies any pain in her neck or in her back.  Her pain is been worsening since it started.  It is made worse with touch.  Made better with elevation and being still.  Of note patient states she did not take her blood pressure medication today.  She states she has this at home and has a primary care doctor however he just did not take it.   HPI     Past Medical History:  Diagnosis Date  . Asthma   . Morbid obesity (HCC)   . Transient ischemic attack (TIA)   . Venous stasis     There are no problems to display for this patient.   Past Surgical History:  Procedure Laterality Date  . LAPAROSCOPIC GASTRIC SLEEVE RESECTION       OB History   No obstetric history on file.     No family history on file.  Social History   Tobacco Use  . Smoking status: Former Games developer  . Smokeless tobacco: Never Used  Substance Use Topics  . Alcohol use: No  . Drug use: No    Home Medications Prior to Admission medications   Medication Sig Start Date End Date Taking? Authorizing Provider  hydrochlorothiazide (HYDRODIURIL) 12.5 MG tablet TAKE 1 TABLET BY MOUTH EVERY DAY IN THE MORNING 06/14/19  Yes [provider]  lisinopril (ZESTRIL) 5 MG tablet  05/14/19  Yes [provider]  ferrous sulfate 325 (65 FE) MG tablet TAKE 1 TABLET BY MOUTH EVERY DAY 02/19/18   [provider]  ondansetron (ZOFRAN ODT) 8 MG disintegrating tablet Take 1 tablet (8 mg total) by mouth every 8 (eight) hours as needed for nausea or vomiting. 05/16/18   Molpus, John, MD  ursodiol (ACTIGALL) 300 MG capsule Take one tab twice a day with food BEGINNING 14 DAYS AFTER SURGERY 01/23/18   [provider]    Allergies    Patient has no known allergies.  Review of Systems   Review of Systems  Constitutional: Negative for chills and fever.  Musculoskeletal: Negative for back pain and neck pain.       Pain in left small finger, left lateral toes.   Neurological: Negative for weakness and headaches.  All other systems reviewed and are negative.   Physical Exam Updated Vital Signs BP (!) 144/71   Pulse 68   Temp 98.3 F (36.8 C) (Oral)   Resp 17   Ht 5\' 11"  (1.803 m)   Wt (!) 140.6 kg   LMP 08/05/2020   SpO2 100%   BMI 43.24 kg/m   Physical Exam Vitals and nursing note reviewed.  Constitutional:      General: She is not in acute distress.  Appearance: She is not ill-appearing.  HENT:     Head: Normocephalic.  Cardiovascular:     Rate and Rhythm: Normal rate.     Pulses: Normal pulses.     Comments: 2+ left DP/PT pulse.  2+ left radial pulse. Pulmonary:     Effort: Pulmonary effort is normal. No respiratory distress.  Musculoskeletal:     Cervical back: Normal range of motion and neck supple. No tenderness.     Comments: Left foot and left ankle, left hand without obvious contusion, edema, deformity.  There is diffuse tenderness over the left small finger, and the left third, fourth, and fifth toes.  There is limited range of motion secondary to pain, however there is intact passive range of motion through the joints however this additionally produces pain.    Skin:    Comments: No skin breaks or tears visualized over left foot/ankle and left hand.   Neurological:     Mental Status: She is alert.     Comments: Patient is awake and alert, interacts appropriately.  Sensation intact to bilateral upper and lower extremities.       ED Results / Procedures / Treatments   Labs (all labs ordered are listed, but only abnormal results are displayed) Labs Reviewed - No data to display  EKG None  Radiology DG Hand Complete Left  Result Date: 08/09/2020 CLINICAL DATA:  Pain after fall EXAM: LEFT HAND - COMPLETE 3+ VIEW COMPARISON:  None. FINDINGS: There is no evidence of fracture or dislocation. There is no evidence of arthropathy or other focal bone abnormality. Soft tissues are unremarkable. IMPRESSION: No acute osseous abnormality. Electronically Signed   By: Maudry Mayhew MD   On: 08/09/2020 22:24   DG Foot Complete Left  Result Date: 08/09/2020 CLINICAL DATA:  Left foot pain after fall EXAM: LEFT FOOT - COMPLETE 3+ VIEW COMPARISON:  None. FINDINGS: There is no evidence of fracture or dislocation. Mild dorsal midfoot degenerative change. Os navicular. Calcaneal enthesophyte. Soft tissues swelling. IMPRESSION: No fracture or dislocation of the left foot. Electronically Signed   By: Maudry Mayhew MD   On: 08/09/2020 22:22    Procedures Procedures   Medications Ordered in ED Medications  ibuprofen (ADVIL) tablet 600 mg (has no administration in time range)    ED Course  I have reviewed the triage vital signs and the nursing notes.  Pertinent labs & imaging results that were available during my care of the patient were reviewed by me and considered in my medical decision making (see chart for details).    MDM Rules/Calculators/A&P                         Patient is a 35 year old woman who presents today for evaluation after mechanical, nonsyncopal fall.  She complains of pain in her left small finger and toes in her left foot and left midfoot. On my exam she has no significant obvious edema.  No deformities or crepitus.  X-rays  are obtained without acute fracture or other acute osseous abnormality.  She does not have any skin breaks or tears. Her left ring and small fingers are buddy taped. We discussed option for bracing and crutches for her foot and it she is given a Aircast and crutches. Recommended OTC medications as needed for pain.  Work note is given. Additionally we discussed that her blood pressure is elevated while in the emergency room.  We discussed the importance of taking her antihypertensives and  following up with her primary care doctor.  Return precautions were discussed with patient who states their understanding.  At the time of discharge patient denied any unaddressed complaints or concerns.  Patient is agreeable for discharge home.  Note: Portions of this report may have been transcribed using voice recognition software. Every effort was made to ensure accuracy; however, inadvertent computerized transcription errors may be present   Final Clinical Impression(s) / ED Diagnoses Final diagnoses:  Fall, initial encounter  Left hand pain  Left foot pain    Rx / DC Orders ED Discharge Orders    None       Cristina Gong, [name] 08/10/20 0050    Alvira Monday, MD 08/10/20 2348

## 2020-08-09 NOTE — Discharge Instructions (Signed)
Please take Ibuprofen (Advil, motrin) and Tylenol (acetaminophen) to relieve your pain.   ? ?You may take up to 600 MG (3 pills) of normal strength ibuprofen every 8 hours as needed.   ?You make take tylenol, up to 1,000 mg (two extra strength pills) every 8 hours as needed.  ? ?It is safe to take ibuprofen and tylenol at the same time as they work differently.  ? Do not take more than 3,000 mg tylenol in a 24 hour period (not more than one dose every 8 hours.  Please check all medication labels as many medications such as pain and cold medications may contain tylenol.  Do not drink alcohol while taking these medications.  Do not take other NSAID'S while taking ibuprofen (such as aleve or naproxen).  Please take ibuprofen with food to decrease stomach upset. ? ?

## 2020-08-09 NOTE — ED Triage Notes (Signed)
Pt reports "my knee went out and I fell"; pt now reports left foot and hand pain; pt says she hit her head; denies N/V or light sensitivity; pt is A&Ox4 and is ambulatory

## 2020-08-25 ENCOUNTER — Other Ambulatory Visit: Payer: Self-pay

## 2020-08-25 ENCOUNTER — Ambulatory Visit (INDEPENDENT_AMBULATORY_CARE_PROVIDER_SITE_OTHER): Payer: BC Managed Care – PPO | Admitting: Family Medicine

## 2020-08-25 ENCOUNTER — Encounter: Payer: Self-pay | Admitting: Family Medicine

## 2020-08-25 ENCOUNTER — Ambulatory Visit: Payer: Self-pay

## 2020-08-25 VITALS — BP 170/82 | Ht 71.0 in | Wt 310.0 lb

## 2020-08-25 DIAGNOSIS — S60222A Contusion of left hand, initial encounter: Secondary | ICD-10-CM | POA: Diagnosis not present

## 2020-08-25 DIAGNOSIS — M7672 Peroneal tendinitis, left leg: Secondary | ICD-10-CM

## 2020-08-25 DIAGNOSIS — M79642 Pain in left hand: Secondary | ICD-10-CM

## 2020-08-25 NOTE — Progress Notes (Signed)
Bailey Villarreal - 35 y.o. female MRN [id number]  Date of birth: [birth year]  SUBJECTIVE:  Including CC & ROS.  No chief complaint on file.   Bailey Villarreal is a 35 y.o. female that is presenting with left hand pain and left foot pain after a fall.  She is unsure of the exact mechanism of the fall.  Pain is been ongoing for a few weeks.  Pain seems to be worse in her foot.  She has tried an Database administrator with limited improvement..  Independent review of the left hand x-ray from 3/27 shows no acute changes.  Independent review of the left foot x-ray from 3/27 shows no acute changes.   Review of Systems See HPI   HISTORY: Past Medical, Surgical, Social, and Family History Reviewed & Updated per EMR.   Pertinent Historical Findings include:  Past Medical History:  Diagnosis Date  . Asthma   . Morbid obesity (HCC)   . Transient ischemic attack (TIA)   . Venous stasis     Past Surgical History:  Procedure Laterality Date  . LAPAROSCOPIC GASTRIC SLEEVE RESECTION      No family history on file.  Social History   Socioeconomic History  . Marital status: Single    Spouse name: Not on file  . Number of children: Not on file  . Years of education: Not on file  . Highest education level: Not on file  Occupational History  . Not on file  Tobacco Use  . Smoking status: Former Games developer  . Smokeless tobacco: Never Used  Substance and Sexual Activity  . Alcohol use: No  . Drug use: No  . Sexual activity: Not on file  Other Topics Concern  . Not on file  Social History Narrative  . Not on file   Social Determinants of Health   Financial Resource Strain: Not on file  Food Insecurity: Not on file  Transportation Needs: Not on file  Physical Activity: Not on file  Stress: Not on file  Social Connections: Not on file  Intimate Partner Violence: Not on file     PHYSICAL EXAM:  VS: BP (!) 170/82 (BP Location: Left Arm, Patient Position: Sitting, Cuff Size: Large)   Ht 5\' 11"  (1.803 m)    Wt (!) 310 lb (140.6 kg)   LMP 08/05/2020   BMI 43.24 kg/m  Physical Exam Gen: NAD, alert, cooperative with exam, well-appearing MSK:  Left hand Normal range of motion. Normal strength resistance. Tenderness palpation over the ulnar side of the fifth metacarpal. Left ankle and foot: Tenderness to palpation over the lateral midfoot and hindfoot. Limited range of motion. Normal strength resistance. Neurovascular intact  Limited ultrasound: Left hand, left foot/ankle:  Left hand: No changes of the fifth MCP joint. No change of the fifth metacarpal shaft. Some hyperemia within the abductor digit he minimi muscle.  Left ankle: No effusion within the ankle joint. There is fluid encircling the peroneal tendons at the lateral malleolus. Normal insertional peroneal brevis into the base of the fifth. Some hyperemia over the lateral aspect of the cuboid.  Summary: Left hand with contusion.  Left foot and ankle seem to be consistent with either cuboid fracture and peroneal tendon retinaculum tear.  Ultrasound and interpretation by 08/07/2020, MD   ASSESSMENT & PLAN:   Peroneal tendinitis of left lower extremity Has changes around the peroneal tendons with suspicion of a tear of the retinaculum.  They do have some scissoring occurring.  Suspicion over the cuboid for possible fracture  as well. -Counseled on home exercise therapy and supportive care. -Cam walker. -Could consider further imaging if needed or physical therapy.  Contusion of left hand Ultrasound reassuring.  Seems to be resolving contusion of the hand. -Counseled on home exercise therapy and supportive care. -Could consider physical therapy.

## 2020-08-25 NOTE — Patient Instructions (Signed)
Nice to meet you Please try the boot  Please try ice as needed Please start the exercises when the pain improves   Please send me a message in MyChart with any questions or updates.  Please see me back in 3 weeks.   --Dr. Jordan Likes

## 2020-08-26 DIAGNOSIS — S60222A Contusion of left hand, initial encounter: Secondary | ICD-10-CM | POA: Insufficient documentation

## 2020-08-26 DIAGNOSIS — M7672 Peroneal tendinitis, left leg: Secondary | ICD-10-CM | POA: Insufficient documentation

## 2020-08-26 NOTE — Assessment & Plan Note (Signed)
Has changes around the peroneal tendons with suspicion of a tear of the retinaculum.  They do have some scissoring occurring.  Suspicion over the cuboid for possible fracture as well. -Counseled on home exercise therapy and supportive care. -Cam walker. -Could consider further imaging if needed or physical therapy.

## 2020-08-26 NOTE — Assessment & Plan Note (Signed)
Ultrasound reassuring.  Seems to be resolving contusion of the hand. -Counseled on home exercise therapy and supportive care. -Could consider physical therapy.

## 2020-09-17 DIAGNOSIS — F4323 Adjustment disorder with mixed anxiety and depressed mood: Secondary | ICD-10-CM | POA: Diagnosis not present

## 2020-09-18 ENCOUNTER — Other Ambulatory Visit: Payer: Self-pay

## 2020-09-18 ENCOUNTER — Ambulatory Visit: Payer: BC Managed Care – PPO | Admitting: Family Medicine

## 2020-09-18 ENCOUNTER — Encounter: Payer: Self-pay | Admitting: Family Medicine

## 2020-09-18 VITALS — BP 150/80 | Ht 71.0 in | Wt 310.0 lb

## 2020-09-18 DIAGNOSIS — S92215A Nondisplaced fracture of cuboid bone of left foot, initial encounter for closed fracture: Secondary | ICD-10-CM | POA: Diagnosis not present

## 2020-09-18 DIAGNOSIS — S60222D Contusion of left hand, subsequent encounter: Secondary | ICD-10-CM

## 2020-09-18 DIAGNOSIS — M7672 Peroneal tendinitis, left leg: Secondary | ICD-10-CM

## 2020-09-18 MED ORDER — PREDNISONE 5 MG PO TABS: ORAL_TABLET | ORAL | 0 refills | Status: DC

## 2020-09-18 NOTE — Progress Notes (Signed)
  Bailey Villarreal - 35 y.o. female MRN [id number]  Date of birth: 1985/06/04  SUBJECTIVE:  Including CC & ROS.  No chief complaint on file.   Bailey Villarreal is a 35 y.o. female that is following up for her left hand and left foot pain.  Her left hand is still having pain but has good range of motion.  Her left foot is still fairly painful and having issues with walking.   Review of Systems See HPI   HISTORY: Past Medical, Surgical, Social, and Family History Reviewed & Updated per EMR.   Pertinent Historical Findings include:  Past Medical History:  Diagnosis Date  . Asthma   . Morbid obesity (HCC)   . Transient ischemic attack (TIA)   . Venous stasis     Past Surgical History:  Procedure Laterality Date  . LAPAROSCOPIC GASTRIC SLEEVE RESECTION      History reviewed. No pertinent family history.  Social History   Socioeconomic History  . Marital status: Single    Spouse name: Not on file  . Number of children: Not on file  . Years of education: Not on file  . Highest education level: Not on file  Occupational History  . Not on file  Tobacco Use  . Smoking status: Former Games developer  . Smokeless tobacco: Never Used  Substance and Sexual Activity  . Alcohol use: No  . Drug use: No  . Sexual activity: Not on file  Other Topics Concern  . Not on file  Social History Narrative  . Not on file   Social Determinants of Health   Financial Resource Strain: Not on file  Food Insecurity: Not on file  Transportation Needs: Not on file  Physical Activity: Not on file  Stress: Not on file  Social Connections: Not on file  Intimate Partner Violence: Not on file     PHYSICAL EXAM:  VS: BP (!) 150/80 (BP Location: Left Arm, Patient Position: Sitting, Cuff Size: Large)   Ht 5\' 11"  (1.803 m)   Wt (!) 310 lb (140.6 kg)   BMI 43.24 kg/m  Physical Exam Gen: NAD, alert, cooperative with exam, well-appearing   ASSESSMENT & PLAN:   Contusion of left hand Continues to have pain  over the PIP joint but does have good motion.  No malrotation or misalignment. -Counseled on home exercise therapy and supportive care. -Consider occupational therapy.  Peroneal tendinitis of left lower extremity Still having ongoing pain that is significant in nature. -Counseled on home exercise therapy and supportive care. -Prednisone. -Counseled on weaning out of the boot.  Closed nondisplaced fracture of cuboid bone of left foot Initial injury on 3/27.  Still having significant pain.  Concern for fracture given duration and inversion injury. -Counseled on supportive care. -May need to pursue MRI.

## 2020-09-18 NOTE — Assessment & Plan Note (Signed)
Continues to have pain over the PIP joint but does have good motion.  No malrotation or misalignment. -Counseled on home exercise therapy and supportive care. -Consider occupational therapy.

## 2020-09-18 NOTE — Assessment & Plan Note (Signed)
Still having ongoing pain that is significant in nature. -Counseled on home exercise therapy and supportive care. -Prednisone. -Counseled on weaning out of the boot.

## 2020-09-18 NOTE — Assessment & Plan Note (Signed)
Initial injury on 3/27.  Still having significant pain.  Concern for fracture given duration and inversion injury. -Counseled on supportive care. -May need to pursue MRI.

## 2020-09-18 NOTE — Patient Instructions (Signed)
Good to see you Please try ice  Please call me Monday with an update Please try the medicine and to wean out of the boot  Please send me a message in MyChart with any questions or updates.  Please see me back in 4 weeks.   --Dr. Jordan Likes

## 2020-09-24 DIAGNOSIS — F4323 Adjustment disorder with mixed anxiety and depressed mood: Secondary | ICD-10-CM | POA: Diagnosis not present

## 2020-10-06 DIAGNOSIS — F4323 Adjustment disorder with mixed anxiety and depressed mood: Secondary | ICD-10-CM | POA: Diagnosis not present

## 2020-10-20 DIAGNOSIS — F4323 Adjustment disorder with mixed anxiety and depressed mood: Secondary | ICD-10-CM | POA: Diagnosis not present

## 2020-12-09 ENCOUNTER — Emergency Department (HOSPITAL_BASED_OUTPATIENT_CLINIC_OR_DEPARTMENT_OTHER)
Admission: EM | Admit: 2020-12-09 | Discharge: 2020-12-10 | Disposition: A | Discharge status: Home / Self Care | Payer: BC Managed Care – PPO | Attending: Emergency Medicine | Admitting: Emergency Medicine

## 2020-12-09 ENCOUNTER — Other Ambulatory Visit: Payer: Self-pay

## 2020-12-09 ENCOUNTER — Emergency Department (HOSPITAL_BASED_OUTPATIENT_CLINIC_OR_DEPARTMENT_OTHER): Payer: BC Managed Care – PPO

## 2020-12-09 ENCOUNTER — Encounter (HOSPITAL_BASED_OUTPATIENT_CLINIC_OR_DEPARTMENT_OTHER): Payer: Self-pay | Admitting: *Deleted

## 2020-12-09 DIAGNOSIS — M25561 Pain in right knee: Secondary | ICD-10-CM | POA: Diagnosis not present

## 2020-12-09 DIAGNOSIS — Z87891 Personal history of nicotine dependence: Secondary | ICD-10-CM | POA: Insufficient documentation

## 2020-12-09 DIAGNOSIS — J45909 Unspecified asthma, uncomplicated: Secondary | ICD-10-CM | POA: Diagnosis not present

## 2020-12-09 MED ORDER — OXYCODONE-ACETAMINOPHEN 5-325 MG PO TABS: 1.0000 | ORAL_TABLET | Freq: Four times a day (QID) | ORAL | 0 refills | Status: AC | PRN

## 2020-12-09 NOTE — Discharge Instructions (Addendum)
You came to the emergency department today to be evaluated for your right knee pain.  Your x-ray showed no broken bones, dislocations, joint effusion, or swelling.  Due to your continued pain I have placed in a knee immobilizer and given crutches.  Please remain nonweightbearing until you can follow-up with your orthopedic provider Dr. Jordan Likes.   I have given you a short course of Percocet.  Please use this medication as prescribed.  Get help right away if: Your foot, leg, toes, or ankle: Tingles or becomes numb. Becomes swollen. Turns pale or blue.

## 2020-12-09 NOTE — ED Provider Notes (Signed)
[facility] EMERGENCY DEPARTMENT Provider Note   CSN: [id number] Arrival date & time: 12/09/20  1957     History Chief Complaint  Patient presents with   Knee Pain    Bailey Villarreal is a 35 y.o. female with a history of morbid obesity, asthma, TIA.  Patient presents to the emergency department with a chief complaint of right knee pain.  Patient reports the pain has been present over the last 20 days.  Patient denies any falls or traumatic injuries.  Pain is located to medial aspect of right knee.  Patient rates pain 9/10 on pain scale.  Pain is worse with movement and straightening of the leg.  Patient has had no relief with naproxen.  Pain does not radiate.  Patient denies any fevers, chills, nausea, vomiting, confusion, numbness, weakness, rash, erythema.   Knee Pain Associated symptoms: no fever       Past Medical History:  Diagnosis Date   Asthma    Morbid obesity (HCC)    Transient ischemic attack (TIA)    Venous stasis     Patient Active Problem List   Diagnosis Date Noted   Closed nondisplaced fracture of cuboid bone of left foot 09/18/2020   Peroneal tendinitis of left lower extremity 08/26/2020   Contusion of left hand 08/26/2020    Past Surgical History:  Procedure Laterality Date   LAPAROSCOPIC GASTRIC SLEEVE RESECTION       OB History   No obstetric history on file.     No family history on file.  Social History   Tobacco Use   Smoking status: Former   Smokeless tobacco: Never  Substance Use Topics   Alcohol use: No   Drug use: No    Home Medications Prior to Admission medications   Medication Sig Start Date End Date Taking? Authorizing Provider  ferrous sulfate 325 (65 FE) MG tablet TAKE 1 TABLET BY MOUTH EVERY DAY 02/19/18   [provider]  hydrochlorothiazide (HYDRODIURIL) 12.5 MG tablet TAKE 1 TABLET BY MOUTH EVERY DAY IN THE MORNING 06/14/19   [provider]  lisinopril (ZESTRIL) 5 MG tablet  05/14/19    [provider]  ondansetron (ZOFRAN ODT) 8 MG disintegrating tablet Take 1 tablet (8 mg total) by mouth every 8 (eight) hours as needed for nausea or vomiting. 05/16/18   Molpus, Jonny Ruiz, MD  predniSONE (DELTASONE) 5 MG tablet Take 6 pills for first day, 5 pills second day, 4 pills third day, 3 pills fourth day, 2 pills the fifth day, and 1 pill sixth day. 09/18/20   Myra Rude, MD  ursodiol (ACTIGALL) 300 MG capsule Take one tab twice a day with food BEGINNING 14 DAYS AFTER SURGERY 01/23/18   [provider]    Allergies    Patient has no known allergies.  Review of Systems   Review of Systems  Constitutional:  Negative for chills and fever.  Gastrointestinal:  Negative for diarrhea, nausea and vomiting.  Musculoskeletal:  Positive for arthralgias, gait problem and myalgias.  Skin:  Negative for color change, pallor, rash and wound.  Neurological:  Negative for weakness and numbness.  Psychiatric/Behavioral:  Negative for confusion.    Physical Exam Updated Vital Signs BP (!) 184/87   Pulse 81   Temp 98.6 F (37 C) (Oral)   Resp 16   Ht 6' (1.829 m)   Wt 136.1 kg   LMP 11/24/2020   SpO2 100%   BMI 40.69 kg/m   Physical Exam Vitals and  nursing note reviewed.  Constitutional:      General: She is not in acute distress.    Appearance: She is not ill-appearing, toxic-appearing or diaphoretic.  HENT:     Head: Normocephalic.  Eyes:     General: No scleral icterus.       Right eye: No discharge.        Left eye: No discharge.  Cardiovascular:     Rate and Rhythm: Normal rate.     Pulses:          Dorsalis pedis pulses are 2+ on the right side and 2+ on the left side.     Heart sounds: Normal heart sounds.  Pulmonary:     Effort: Pulmonary effort is normal.     Breath sounds: Normal breath sounds.  Abdominal:     Palpations: Abdomen is soft.     Tenderness: There is no abdominal tenderness.  Musculoskeletal:     Cervical back: Neck supple.     Right  knee: No swelling, deformity, effusion, erythema, ecchymosis, lacerations, bony tenderness or crepitus. Decreased range of motion. Tenderness present. Normal alignment.     Instability Tests: Anterior drawer test negative. Posterior drawer test negative.     Left knee: No swelling, deformity, effusion, erythema, ecchymosis, lacerations, bony tenderness or crepitus. Normal range of motion. No tenderness. Normal alignment.     Right lower leg: Normal.     Left lower leg: Normal.     Right ankle: No swelling, deformity, ecchymosis or lacerations. No tenderness. Normal range of motion.     Left ankle: No swelling, deformity, ecchymosis or lacerations. No tenderness. Normal range of motion.     Right foot: Normal range of motion and normal capillary refill. No swelling, deformity, laceration, tenderness, bony tenderness or crepitus. Normal pulse.     Left foot: Normal range of motion and normal capillary refill. No swelling, deformity, laceration, tenderness, bony tenderness or crepitus. Normal pulse.     Comments: Tenderness to medial aspect of right knee superior to the joint line.  Decreased flexion and extension.  Negative anterior posterior drawer.  No palpable defect no palpable defect to quadriceps tendon or patella tendon.  No swelling, rash, erythema.  Patient able to stand and bear weight.  Feet:     Right foot:     Skin integrity: Skin integrity normal.     Left foot:     Skin integrity: Skin integrity normal.  Skin:    General: Skin is warm and dry.  Neurological:     General: No focal deficit present.     Mental Status: She is alert and oriented to person, place, and time.     GCS: GCS eye subscore is 4. GCS verbal subscore is 5. GCS motor subscore is 6.  Psychiatric:        Behavior: Behavior is cooperative.    ED Results / Procedures / Treatments   Labs (all labs ordered are listed, but only abnormal results are displayed) Labs Reviewed - No data to  display  EKG None  Radiology DG Knee Complete 4 Views Right  Result Date: 12/09/2020 CLINICAL DATA:  35 year old female with right knee pain and swelling. EXAM: RIGHT KNEE - COMPLETE 4+ VIEW COMPARISON:  Right knee radiograph dated 11/22/2016. FINDINGS: There is no acute fracture or dislocation. The bones are well mineralized. No significant arthritic changes. No joint effusion. The soft tissues are unremarkable. IMPRESSION: Negative. Electronically Signed   By: Elgie Collard M.D.   On: 12/09/2020  20:33    Procedures Procedures   Medications Ordered in ED Medications - No data to display  ED Course  I have reviewed the triage vital signs and the nursing notes.  Pertinent labs & imaging results that were available during my care of the patient were reviewed by me and considered in my medical decision making (see chart for details).    MDM Rules/Calculators/A&P                           Alert 35 year old female in no acute distress, nontoxic appearing.  Patient is morbidly obese.  Presents to the emergency department with a chief complaint of right arm pain.  Pain has been present over the last 20 days.  No known traumatic injury.  Tenderness to medial aspect of right knee superior to the joint line.  Decreased flexion and extension.  Negative anterior posterior drawer.  No palpable defect no palpable defect to quadriceps tendon or patella tendon.  No swelling, rash, erythema.  Patient able to stand and bear weight.  Low suspicion for septic arthritis as patient has no swelling, erythema, or warmth.  X-ray imaging shows no acute fracture or dislocation, no significant arthritic changes, no joint effusion, soft tissues are unremarkable.  Concern for possible meniscus or ligamentous injury.  Will place patient in knee immobilizer maker nonweightbearing with crutches.  Patient given information to follow-up with Dr. Jordan Likes who she has previously seen.  Patient given short course of  Percocet pain medication.  Discussed results, findings, treatment and follow up. Patient advised of return precautions. Patient verbalized understanding and agreed with plan.   Final Clinical Impression(s) / ED Diagnoses Final diagnoses:  Acute pain of right knee    Rx / DC Orders ED Discharge Orders          Ordered    oxyCODONE-acetaminophen (PERCOCET/ROXICET) 5-325 MG tablet  Every 6 hours PRN        12/09/20 2310             Berneice Heinrich 12/10/20 0214    Vanetta Mulders, MD 12/17/20 [phone]

## 2020-12-09 NOTE — ED Notes (Signed)
Pt refused crutches, she states she already has a pair at home.

## 2020-12-09 NOTE — ED Triage Notes (Signed)
C/o knee pain x 20 days denies injury

## 2020-12-10 NOTE — ED Notes (Signed)
Pt declined Crutches

## 2020-12-14 DIAGNOSIS — M1711 Unilateral primary osteoarthritis, right knee: Secondary | ICD-10-CM | POA: Diagnosis not present

## 2020-12-14 DIAGNOSIS — M25562 Pain in left knee: Secondary | ICD-10-CM | POA: Diagnosis not present

## 2020-12-14 DIAGNOSIS — M25561 Pain in right knee: Secondary | ICD-10-CM | POA: Diagnosis not present

## 2021-01-09 ENCOUNTER — Other Ambulatory Visit: Payer: Self-pay

## 2021-01-09 DIAGNOSIS — Z87891 Personal history of nicotine dependence: Secondary | ICD-10-CM | POA: Diagnosis not present

## 2021-01-09 DIAGNOSIS — R102 Pelvic and perineal pain: Secondary | ICD-10-CM | POA: Insufficient documentation

## 2021-01-09 DIAGNOSIS — J45909 Unspecified asthma, uncomplicated: Secondary | ICD-10-CM | POA: Insufficient documentation

## 2021-01-09 DIAGNOSIS — I10 Essential (primary) hypertension: Secondary | ICD-10-CM | POA: Diagnosis not present

## 2021-01-09 DIAGNOSIS — Z20822 Contact with and (suspected) exposure to covid-19: Secondary | ICD-10-CM | POA: Insufficient documentation

## 2021-01-09 DIAGNOSIS — Z6841 Body Mass Index (BMI) 40.0 and over, adult: Secondary | ICD-10-CM | POA: Diagnosis not present

## 2021-01-09 DIAGNOSIS — D649 Anemia, unspecified: Secondary | ICD-10-CM | POA: Insufficient documentation

## 2021-01-09 DIAGNOSIS — N939 Abnormal uterine and vaginal bleeding, unspecified: Principal | ICD-10-CM | POA: Insufficient documentation

## 2021-01-09 DIAGNOSIS — E669 Obesity, unspecified: Secondary | ICD-10-CM | POA: Insufficient documentation

## 2021-01-09 NOTE — ED Triage Notes (Addendum)
Pt states she has had 3 menstrual cycles this month and is concerned for worsening anemia. She c/o weakness since yesterday when the bleeding returned.

## 2021-01-10 ENCOUNTER — Observation Stay (HOSPITAL_COMMUNITY): Payer: BC Managed Care – PPO

## 2021-01-10 ENCOUNTER — Other Ambulatory Visit: Payer: Self-pay

## 2021-01-10 ENCOUNTER — Observation Stay (HOSPITAL_BASED_OUTPATIENT_CLINIC_OR_DEPARTMENT_OTHER)
Admission: EM | Admit: 2021-01-10 | Discharge: 2021-01-11 | Disposition: A | Discharge status: Home / Self Care | Payer: BC Managed Care – PPO | Attending: Obstetrics & Gynecology | Admitting: Obstetrics & Gynecology

## 2021-01-10 ENCOUNTER — Encounter (HOSPITAL_BASED_OUTPATIENT_CLINIC_OR_DEPARTMENT_OTHER): Payer: Self-pay | Admitting: Emergency Medicine

## 2021-01-10 DIAGNOSIS — N938 Other specified abnormal uterine and vaginal bleeding: Secondary | ICD-10-CM | POA: Diagnosis not present

## 2021-01-10 DIAGNOSIS — D649 Anemia, unspecified: Secondary | ICD-10-CM | POA: Diagnosis not present

## 2021-01-10 DIAGNOSIS — R102 Pelvic and perineal pain: Secondary | ICD-10-CM | POA: Diagnosis not present

## 2021-01-10 DIAGNOSIS — Z6841 Body Mass Index (BMI) 40.0 and over, adult: Secondary | ICD-10-CM | POA: Diagnosis not present

## 2021-01-10 DIAGNOSIS — N939 Abnormal uterine and vaginal bleeding, unspecified: Secondary | ICD-10-CM | POA: Diagnosis not present

## 2021-01-10 DIAGNOSIS — Z87891 Personal history of nicotine dependence: Secondary | ICD-10-CM | POA: Diagnosis not present

## 2021-01-10 DIAGNOSIS — E669 Obesity, unspecified: Secondary | ICD-10-CM | POA: Diagnosis not present

## 2021-01-10 DIAGNOSIS — I10 Essential (primary) hypertension: Secondary | ICD-10-CM | POA: Diagnosis not present

## 2021-01-10 DIAGNOSIS — Z20822 Contact with and (suspected) exposure to covid-19: Secondary | ICD-10-CM | POA: Diagnosis not present

## 2021-01-10 DIAGNOSIS — J45909 Unspecified asthma, uncomplicated: Secondary | ICD-10-CM | POA: Diagnosis not present

## 2021-01-10 HISTORY — DX: Anemia, unspecified: D64.9

## 2021-01-10 HISTORY — DX: Other specified abnormal uterine and vaginal bleeding: N93.8

## 2021-01-10 LAB — COMPREHENSIVE METABOLIC PANEL
ALT: 12 U/L (ref 0–44)
AST: 16 U/L (ref 15–41)
Albumin: 3.6 g/dL (ref 3.5–5.0)
Alkaline Phosphatase: 82 U/L (ref 38–126)
Anion gap: 5 (ref 5–15)
BUN: 14 mg/dL (ref 6–20)
CO2: 28 mmol/L (ref 22–32)
Calcium: 8.5 mg/dL — ABNORMAL LOW (ref 8.9–10.3)
Chloride: 104 mmol/L (ref 98–111)
Creatinine, Ser: 0.73 mg/dL (ref 0.44–1.00)
GFR, Estimated: >60 mL/min (ref >60)
Glucose, Bld: 93 mg/dL (ref 70–99)
Potassium: 4.2 mmol/L (ref 3.5–5.1)
Sodium: 137 mmol/L (ref 135–145)
Total Bilirubin: 0.2 mg/dL — ABNORMAL LOW (ref 0.3–1.2)
Total Protein: 7.5 g/dL (ref 6.5–8.1)

## 2021-01-10 LAB — CBC WITH DIFFERENTIAL/PLATELET
Abs Immature Granulocytes: 0.02 10*3/uL (ref 0.00–0.07)
Basophils Absolute: 0 10*3/uL (ref 0.0–0.1)
Basophils Relative: 0 %
Eosinophils Absolute: 0.3 10*3/uL (ref 0.0–0.5)
Eosinophils Relative: 3 %
HCT: 27.8 % — ABNORMAL LOW (ref 36.0–46.0)
Hemoglobin: 7.8 g/dL — ABNORMAL LOW (ref 12.0–15.0)
Immature Granulocytes: 0 %
Lymphocytes Relative: 32 %
Lymphs Abs: 2.6 10*3/uL (ref 0.7–4.0)
MCH: 19.9 pg — ABNORMAL LOW (ref 26.0–34.0)
MCHC: 28.1 g/dL — ABNORMAL LOW (ref 30.0–36.0)
MCV: 70.9 fL — ABNORMAL LOW (ref 80.0–100.0)
Monocytes Absolute: 0.6 10*3/uL (ref 0.1–1.0)
Monocytes Relative: 8 %
Neutro Abs: 4.6 10*3/uL (ref 1.7–7.7)
Neutrophils Relative %: 57 %
Platelets: 224 10*3/uL (ref 150–400)
RBC: 3.92 MIL/uL (ref 3.87–5.11)
RDW: 18.9 % — ABNORMAL HIGH (ref 11.5–15.5)
WBC: 8.1 10*3/uL (ref 4.0–10.5)
nRBC: 0 % (ref 0.0–0.2)

## 2021-01-10 LAB — HCG, SERUM, QUALITATIVE: Preg, Serum: NEGATIVE

## 2021-01-10 LAB — WET PREP, GENITAL
Clue Cells Wet Prep HPF POC: NONE SEEN
Sperm: NONE SEEN
Trich, Wet Prep: NONE SEEN
Yeast Wet Prep HPF POC: NONE SEEN

## 2021-01-10 LAB — PREPARE RBC (CROSSMATCH)

## 2021-01-10 LAB — RESP PANEL BY RT-PCR (FLU A&B, COVID) ARPGX2
Influenza A by PCR: NEGATIVE
Influenza B by PCR: NEGATIVE
SARS Coronavirus 2 by RT PCR: NEGATIVE

## 2021-01-10 LAB — HIV ANTIBODY (ROUTINE TESTING W REFLEX): HIV Screen 4th Generation wRfx: NONREACTIVE

## 2021-01-10 LAB — ABO/RH: ABO/RH(D): O POS

## 2021-01-10 MED ORDER — HYDRALAZINE HCL 20 MG/ML IJ SOLN: 5.0000 mg | INTRAMUSCULAR | Status: DC | PRN

## 2021-01-10 MED ORDER — KETOROLAC TROMETHAMINE 15 MG/ML IJ SOLN: 15.0000 mg | Freq: Four times a day (QID) | INTRAMUSCULAR | Status: DC | PRN

## 2021-01-10 MED ORDER — KETOROLAC TROMETHAMINE 15 MG/ML IJ SOLN
15.0000 mg | Freq: Once | INTRAMUSCULAR | Status: AC
  Administered 2021-01-10: 15 mg via INTRAVENOUS
  Filled 2021-01-10: qty 1

## 2021-01-10 MED ORDER — ACETAMINOPHEN 650 MG RE SUPP: 650.0000 mg | Freq: Four times a day (QID) | RECTAL | Status: DC | PRN

## 2021-01-10 MED ORDER — ONDANSETRON HCL 4 MG/2ML IJ SOLN: 4.0000 mg | Freq: Four times a day (QID) | INTRAMUSCULAR | Status: DC | PRN

## 2021-01-10 MED ORDER — ACETAMINOPHEN 325 MG PO TABS
650.0000 mg | ORAL_TABLET | Freq: Four times a day (QID) | ORAL | Status: DC | PRN
  Administered 2021-01-10: 650 mg via ORAL
  Filled 2021-01-10: qty 2

## 2021-01-10 MED ORDER — SODIUM CHLORIDE 0.9% IV SOLUTION: Freq: Once | INTRAVENOUS | Status: AC

## 2021-01-10 MED ORDER — ONDANSETRON HCL 4 MG PO TABS: 4.0000 mg | ORAL_TABLET | Freq: Four times a day (QID) | ORAL | Status: DC | PRN

## 2021-01-10 MED ORDER — MEGESTROL ACETATE 20 MG PO TABS
20.0000 mg | ORAL_TABLET | Freq: Two times a day (BID) | ORAL | Status: DC
  Administered 2021-01-10 – 2021-01-11 (×3): 20 mg via ORAL
  Filled 2021-01-10 (×4): qty 1

## 2021-01-10 NOTE — H&P (Signed)
History and Physical    Bailey Villarreal NWG:[id number] DOB: 08/16/1985 DOA: 01/10/2021  PCP: Has a PCP, doesn't know the name Consultants:  [facility] - bariatrics; Cousins - OB/GYN Patient coming from:  Home - lives with 2 roommates; NOK: Vance Peper, aunt, [phone]  Chief Complaint: DUB  HPI: Bailey Villarreal is a 35 y.o. female with medical history significant of HTN and morbid obesity s/p sleeve gastrectomy in 2019 presenting with DUB.  She has had 3 cycles since July 11.  The last 2 lasted 8-10 days, only 10 days between cycles.  She is feeling more weak, lots of pain.  She is having abdominal and pelvic pain and lower back pain.  She has not had an [place].  Last pap was maybe last year.  Current period started Friday and she is still bleeding.  She is having heavy flow with clotting.  She is wearing a pad and tampon - tampon q20 min, pad every hour.  Typical period is 4-6 days and not as heavy.  She thought maybe this was related to stress.  2 partners in the last year, using condoms or nothing for birth control.  She has not been on birth control for period control because "birth control means as much sex as you want and that's not what I want, trying to practice celibacy."  She has never had an IUD.  She has been pregnant twice with 2 miscarriages.  She does want children "if they come within the next 3 years."   ED Course: [facility] to [facility] transfer, per Dr. Antionette Char:  Abnormal uterine bleeding with symptomatic anemia with intermittent vaginal bleeding for a month and now fatigue and lightheadedness. She has stable vitals and Hgb 7.8, down from 9.4 in Oct 2021. Dr. Para March of OBGYN said she should be admitted to hospitalists and they would consult. COVID screen pending.   Review of Systems: As per HPI; otherwise review of systems reviewed and negative.   Ambulatory Status:  Ambulates without assistance  COVID Vaccine Status:  Complete, no booster  Past Medical History:  Diagnosis Date   Anemia    Asthma     DUB (dysfunctional uterine bleeding)    Morbid obesity (HCC)    Transient ischemic attack (TIA)    Venous stasis     Past Surgical History:  Procedure Laterality Date   LAPAROSCOPIC GASTRIC SLEEVE RESECTION      Social History   Socioeconomic History   Marital status: Single    Spouse name: Not on file   Number of children: Not on file   Years of education: Not on file   Highest education level: Not on file  Occupational History   Occupation: bank, hotel, coach basketball  Tobacco Use   Smoking status: Former    Types: Cigars    Quit date: 2012    Years since quitting: 10.6   Smokeless tobacco: Never  Substance and Sexual Activity   Alcohol use: Yes    Comment: social drinking, none since May   Drug use: No   Sexual activity: Not on file  Other Topics Concern   Not on file  Social History Narrative   Not on file   Social Determinants of Health   Financial Resource Strain: Not on file  Food Insecurity: Not on file  Transportation Needs: Not on file  Physical Activity: Not on file  Stress: Not on file  Social Connections: Not on file  Intimate Partner Violence: Not on file    No Known Allergies  History reviewed. No pertinent family history.  Prior to Admission medications   Medication Sig Start Date End Date Taking? Authorizing Provider  diphenhydrAMINE-Acetaminophen (PERCOGESIC PO) Take 2 tablets by mouth 2 (two) times daily as needed (pain).   Yes [provider]  meloxicam (MOBIC) 15 MG tablet Take 15 mg by mouth daily. 12/14/20  Yes [provider]  oxyCODONE-acetaminophen (PERCOCET/ROXICET) 5-325 MG tablet Take 1 tablet by mouth every 4 (four) hours as needed for pain. 12/14/20  Yes [provider]    Physical Exam: Vitals:   01/10/21 0215 01/10/21 0345 01/10/21 0702 01/10/21 0932  BP: (!) 175/77 (!) 179/77 (!) 169/73 (!) 146/82  Pulse: (!) 58 (!) 55 69 62  Resp: 20 18 18 18   Temp:  98.3 F (36.8 C)  98.2 F (36.8 C)   TempSrc:  Oral  Oral  SpO2: 100% 100% 100% 100%  Weight:      Height:         General:  Appears calm and comfortable and is in NAD Eyes:  EOMI, normal lids, iris ENT:  grossly normal hearing, lips & tongue, mmm Neck:  no LAD, masses or thyromegaly Cardiovascular:  RRR, no m/r/g. No LE edema.  Respiratory:   CTA bilaterally with no wheezes/rales/rhonchi.  Normal respiratory effort. Abdomen:  soft, obese, mild lower abdominal TTP Back:   normal alignment, no CVAT Skin:  no rash or induration seen on limited exam Musculoskeletal:  grossly normal tone BUE/BLE, good ROM, no bony abnormality Psychiatric:  grossly normal mood and affect, speech fluent and appropriate, AOx3 Neurologic:  CN 2-12 grossly intact, moves all extremities in coordinated fashion    Radiological Exams on Admission: Independently reviewed - see discussion in A/P where applicable  PELVIC COMPLETE WITH TRANSVAGINAL  Result Date: 01/10/2021 CLINICAL DATA:  Dysfunctional uterine bleeding. EXAM: TRANSABDOMINAL AND TRANSVAGINAL ULTRASOUND OF PELVIS TECHNIQUE: Both transabdominal and transvaginal ultrasound examinations of the pelvis were performed. Transabdominal technique was performed for global imaging of the pelvis including uterus, ovaries, adnexal regions, and pelvic cul-de-sac. It was necessary to proceed with endovaginal exam following the transabdominal exam to visualize the endometrium and ovaries. COMPARISON:  None FINDINGS: Uterus Measurements: 9.6 x 3.7 x 4.7 cm = volume: 86.8 mL. No fibroids or other mass visualized. Endometrium Thickness: 6.4 mm. The 6.4 mm measurement of the endometrial stripe complex includes 2 mm of fluid in the canal. Right ovary Measurements: 3.1 x 1.5 x 1.9 cm = volume: 4.6 mL. Normal appearance/no adnexal mass. Left ovary Measurements: 2.0 x 1.5 x 1.4 cm = volume: 2.2 mL. Normal appearance/no adnexal mass. Other findings Trace fluid in the cul-de-sac. IMPRESSION: 1. The endometrial  stripe complex measures 6.4 mm including a small amount of fluid in the canal. The endometrial tissue itself measures approximately 4.4 mm. If bleeding remains unresponsive to hormonal or medical therapy, sonohysterogram should be considered for focal lesion work-up. (Ref: Radiological Reasoning: Algorithmic Workup of Abnormal Vaginal Bleeding with Endovaginal Sonography and Sonohysterography. AJR 2008; 2009) 2. Trace fluid in the cul-de-sac is likely physiologic. Electronically Signed   By: [id number] III M.D.   On: 01/10/2021 13:40    EKG: none   Labs on Admission: I have personally reviewed the available labs and imaging studies at the time of the admission.  Pertinent labs:   Unremarkable CMP WBC 8.1 Hgb 7.8; 9.6 on 05/16/18 - appears to be baseline Pregnancy negative COVID/flu negative   Assessment/Plan Principal Problem:   Abnormal uterine bleeding Active Problems:  Anemia   Obesity, Class III, BMI 40-49.9 (morbid obesity) (HCC)   DUB -Patient with recent (6 weeks) onset of DUB -Most recently, she has had 3 back-to-back cycles, the last 10 days apart -Her most recent menses started on Friday and she is using 1 tampon q20 min with a pad q1h -She has never been on hormonal contraceptives and does report interest in ongoing fertility -For now, will observe and plan for GYN consultation (Update: Dr. Charlotta Newton has agreed to accept transfer of the patient for the remainder of her hospital course) -She reports never having had imaging so will order pelvic US -Toradol as needed for pain  Anemia -Patient with chronic anemia, baseline slightly higher than 9 -Her current Hgb is 7.8, so slightly lower than her usual baseline -There does not appear to be an indication for transfusion at this time but she has been consented in case her bleeding causes worsening -Needs her DUB addressed to prevent recurrent issues -Will follow q12h CBC with plan to transfuse for Hgb  <7  Obesity -s/p gastric sleeve -Body mass index is 41.84 kg/m..  -Weight loss should be encouraged -Outpatient PCP/bariatric medicine f/u encouraged     Note: This patient has been tested and is negative for the novel coronavirus COVID-19. The patient has been fully vaccinated against COVID-19.   Level of care: Med-Surg DVT prophylaxis: SCDs Code Status:  Full  Family Communication: None present; I spoke with the patient's aunt by telephone at the time of admission. Disposition Plan:  The patient is from: home  Anticipated d/c is to: home without [facility] services   Anticipated d/c date will depend on clinical response to treatment, but possibly as early as tomorrow if she has excellent response to treatment  Patient is currently: acutely ill Consults called: GYN (care is being assumed by their service at this time)  Admission status:  It is my clinical opinion that referral for OBSERVATION is reasonable and necessary in this patient based on the above information provided. The aforementioned taken together are felt to place the patient at high risk for further clinical deterioration. However it is anticipated that the patient may be medically stable for discharge from the hospital within 24 to 48 hours.    Jonah Blue MD [facility]   How to contact the [facility] Attending or Consulting provider 7A - 7P or covering provider during after hours 7P -7A, for this patient?  Check the care team in [facility] and look for a) attending/consulting TRH provider listed and b) the [facility] team listed Log into [email or web address] and use [employer]'s universal password to access. If you do not have the password, please contact the hospital operator. Locate the [facility] provider you are looking for under [facility] and page to a number that you can be directly reached. If you still have difficulty reaching the provider, please page the [facility] (Director on Call) for the Hospitalists listed on amion for  assistance.   01/10/2021, 3:07 PM

## 2021-01-10 NOTE — Consult Note (Signed)
Faculty Practice Obstetrics and Gynecology Consult Note  Bailey Villarreal is a 35 y.o. G2P0020 who presented due to continued abnormal uterine bleeding.  She notes that since July she has had 3 periods- typically she will have an 8-10 day period then no bleeding for 10-18 days then bleeding will return.  Period recently started again on Friday, changing both large pad/tampon every hour.  Also notes considerable pelvic pain improved with OTC medication.  Previously seen by Dr. Cherly Hensen several years ago and has not been seen since that time.  She is not sure about irregular discharge due to bleeding- denies vaginal itching or odor. She does report fatigue, weakness and dizziness with activity.  She knows she should be taking a vitamin/supplement, but has not been doing this regularly.  Pt reports she may have been told she needed a transfusion in the past, but has declined, she feels like she is know at that point.   Denies fevers, chills, sweats, dysuria, nausea, vomiting, other GI or GU symptoms or other general symptoms.  Past Medical History:  Diagnosis Date   Anemia    Asthma    DUB (dysfunctional uterine bleeding)    Morbid obesity (HCC)    Transient ischemic attack (TIA)    Venous stasis    Past Surgical History:  Procedure Laterality Date   LAPAROSCOPIC GASTRIC SLEEVE RESECTION     OBHX: SAB x2  GYNHx: Last pap 05/2019 neg   No current facility-administered medications on file prior to encounter.   Current Outpatient Medications on File Prior to Encounter  Medication Sig Dispense Refill   diphenhydrAMINE-Acetaminophen (PERCOGESIC PO) Take 2 tablets by mouth 2 (two) times daily as needed (pain).     meloxicam (MOBIC) 15 MG tablet Take 15 mg by mouth daily.     oxyCODONE-acetaminophen (PERCOCET/ROXICET) 5-325 MG tablet Take 1 tablet by mouth every 4 (four) hours as needed for pain.     No Known Allergies  Social History:   reports that she quit smoking about 10 years ago. Her  smoking use included cigars. She has never used smokeless tobacco. She reports current alcohol use. She reports that she does not use drugs. History reviewed. No pertinent family history.  Review of Systems: Pertinent items noted in HPI and remainder of comprehensive ROS otherwise negative.  PHYSICAL EXAM: Blood pressure (!) 146/82, pulse 62, temperature 98.2 F (36.8 C), temperature source Oral, resp. rate 18, height 5\' 11"  (1.803 m), weight 136.1 kg, last menstrual period 01/09/2021, SpO2 100 %. CONSTITUTIONAL: Well-developed, well-nourished female in no acute distress.  HENT:  Normocephalic, atraumatic, External right and left ear normal. EYES: Conjunctivae and EOM are normal. Pupils are equal, round, and reactive to light. No scleral icterus.  NECK: Normal range of motion, supple, no masses SKIN: Skin is warm and dry. No rash noted. Not diaphoretic. No erythema. No pallor. NEUROLOGIC: Alert and oriented to person, place, and time. PSYCHIATRIC: Normal mood and affect. Normal behavior. Normal judgment and thought content. CARDIOVASCULAR: Normal heart rate noted, regular rhythm RESPIRATORY: Effort and breath sounds normal, no problems with respiration noted ABDOMEN: obese, Soft, nontender, non-distended, suspect enlarged uterus though difficult to appreciated due to body habitus PELVIC: Normal appearing external genitalia; normal appearing vaginal mucosa and cervix.  Dark blood noted in vault, no active bleeding appreciated.  Difficult bimanual exam due to body habitus and discomfort, no discrete abnormalities appreciated. MUSCULOSKELETAL: Normal range of motion. No tenderness.  No cyanosis or edema.  Labs: Results for orders placed or performed during  the hospital encounter of 01/10/21 (from the past 336 hour(s))  CBC with Differential   Collection Time: 01/10/21 12:08 AM  Result Value Ref Range   WBC 8.1 4.0 - 10.5 K/uL   RBC 3.92 3.87 - 5.11 MIL/uL   Hemoglobin 7.8 (L) 12.0 - 15.0 g/dL    HCT 64.3 (L) 32.9 - 46.0 %   MCV 70.9 (L) 80.0 - 100.0 fL   MCH 19.9 (L) 26.0 - 34.0 pg   MCHC 28.1 (L) 30.0 - 36.0 g/dL   RDW 51.8 (H) 84.1 - 66.0 %   Platelets 224 150 - 400 K/uL   nRBC 0.0 0.0 - 0.2 %   Neutrophils Relative % 57 %   Neutro Abs 4.6 1.7 - 7.7 K/uL   Lymphocytes Relative 32 %   Lymphs Abs 2.6 0.7 - 4.0 K/uL   Monocytes Relative 8 %   Monocytes Absolute 0.6 0.1 - 1.0 K/uL   Eosinophils Relative 3 %   Eosinophils Absolute 0.3 0.0 - 0.5 K/uL   Basophils Relative 0 %   Basophils Absolute 0.0 0.0 - 0.1 K/uL   Immature Granulocytes 0 %   Abs Immature Granulocytes 0.02 0.00 - 0.07 K/uL  Comprehensive metabolic panel   Collection Time: 01/10/21 12:08 AM  Result Value Ref Range   Sodium 137 135 - 145 mmol/L   Potassium 4.2 3.5 - 5.1 mmol/L   Chloride 104 98 - 111 mmol/L   CO2 28 22 - 32 mmol/L   Glucose, Bld 93 70 - 99 mg/dL   BUN 14 6 - 20 mg/dL   Creatinine, Ser 6.30 0.44 - 1.00 mg/dL   Calcium 8.5 (L) 8.9 - 10.3 mg/dL   Total Protein 7.5 6.5 - 8.1 g/dL   Albumin 3.6 3.5 - 5.0 g/dL   AST 16 15 - 41 U/L   ALT 12 0 - 44 U/L   Alkaline Phosphatase 82 38 - 126 U/L   Total Bilirubin 0.2 (L) 0.3 - 1.2 mg/dL   GFR, Estimated >16 >01 mL/min   Anion gap 5 5 - 15  hCG, serum, qualitative   Collection Time: 01/10/21  2:03 AM  Result Value Ref Range   Preg, Serum NEGATIVE NEGATIVE  Resp Panel by RT-PCR (Flu A&B, Covid) Nasopharyngeal Swab   Collection Time: 01/10/21  4:31 AM   Specimen: Nasopharyngeal Swab; Nasopharyngeal(NP) swabs in vial transport medium  Result Value Ref Range   SARS Coronavirus 2 by RT PCR NEGATIVE NEGATIVE   Influenza A by PCR NEGATIVE NEGATIVE   Influenza B by PCR NEGATIVE NEGATIVE    Imaging Studies: Pelvic US to be completed  Assessment: Active Problems:   Abnormal uterine bleeding Morbid obesity  Plan: Symptomatic anemia  Transfuse 2uPRBC with repeat CBC AUB  Pelvic US ordered  Plan to start Megace 20mg  bid  Though irregular  bleeding noted, this can be managed further as an outpatient.  Plan to continue with Megace until patient either establishes care with [facility] or Dr. [facility]  Questions and concerns were addressed, pt agreeable to above plan  Cherly Hensen, DO Attending Obstetrician & Gynecologist, [facility] Center for [facility], [facility]

## 2021-01-10 NOTE — Progress Notes (Signed)
Patient arrived to [facility] room 27. Alert and oriented x4. Bed in lowest position. Call light in reach. Will continue to monitor pt. Awaiting new orders.

## 2021-01-10 NOTE — ED Notes (Signed)
Report given to carelink 

## 2021-01-10 NOTE — ED Provider Notes (Signed)
[facility] Provider Note: Bailey Dell, MD, FACEP  CSN: [id number] MRN: [id number] ARRIVAL: 01/09/21 at 2343 ROOM: MH05/MH05   CHIEF COMPLAINT  Weakness   HISTORY OF PRESENT ILLNESS  01/10/21 3:41 AM Bailey Villarreal is a 35 y.o. female who has had episodes of heavy vaginal bleeding (heavier than a period, and more prolonged than a period) beginning 11/23/2020, 12/07/2020, 12/18/2020 and again yesterday.  Since her bleeding began yesterday she is feeling generalized weakness and easy fatigue but not shortness of breath.  She has also had some lightheadedness.  She is having uterine cramps which she states are not something she usually has.  She rates the pain as an 8 out of 10.  She is reestablishing with Dr. Cherly Hensen for OB/GYN care but has not seen her in several years.    Past Medical History:  Diagnosis Date   Anemia    Asthma    Morbid obesity (HCC)    Transient ischemic attack (TIA)    Venous stasis     Past Surgical History:  Procedure Laterality Date   LAPAROSCOPIC GASTRIC SLEEVE RESECTION      No family history on file.  Social History   Tobacco Use   Smoking status: Former   Smokeless tobacco: Never  Substance Use Topics   Alcohol use: No   Drug use: No    Prior to Admission medications   Medication Sig Start Date End Date Taking? Authorizing Provider  ferrous sulfate 325 (65 FE) MG tablet TAKE 1 TABLET BY MOUTH EVERY DAY 02/19/18   [provider]  hydrochlorothiazide (HYDRODIURIL) 12.5 MG tablet TAKE 1 TABLET BY MOUTH EVERY DAY IN THE MORNING 06/14/19   [provider]  lisinopril (ZESTRIL) 5 MG tablet  05/14/19   [provider]  ondansetron (ZOFRAN ODT) 8 MG disintegrating tablet Take 1 tablet (8 mg total) by mouth every 8 (eight) hours as needed for nausea or vomiting. 05/16/18   Kenyona Rena, Jonny Ruiz, MD  predniSONE (DELTASONE) 5 MG tablet Take 6 pills for first day, 5 pills second day, 4 pills third day, 3 pills fourth day, 2  pills the fifth day, and 1 pill sixth day. 09/18/20   Myra Rude, MD  ursodiol (ACTIGALL) 300 MG capsule Take one tab twice a day with food BEGINNING 14 DAYS AFTER SURGERY 01/23/18   [provider]    Allergies Patient has no known allergies.   REVIEW OF SYSTEMS  Negative except as noted here or in the History of Present Illness.   PHYSICAL EXAMINATION  Initial Vital Signs Blood pressure (!) 175/77, pulse (!) 58, temperature 98.3 F (36.8 C), temperature source Oral, resp. rate 20, height 5\' 11"  (1.803 m), weight 136.1 kg, last menstrual period 01/09/2021, SpO2 100 %.  Examination General: Well-developed, high BMI female in no acute distress; appearance consistent with age of record HENT: normocephalic; atraumatic Eyes: pupils equal, round and reactive to light; extraocular muscles intact Neck: supple Heart: regular rate and rhythm Lungs: clear to auscultation bilaterally Abdomen: soft; nondistended; suprapubic tenderness; bowel sounds present Extremities: No deformity; full range of motion; pulses normal Neurologic: Awake, alert and oriented; motor function intact in all extremities and symmetric; no facial droop Skin: Warm and dry Psychiatric: Normal mood and affect   RESULTS  Summary of this visit's results, reviewed and interpreted by myself:   EKG Interpretation  Date/Time:    Ventricular Rate:    PR Interval:    QRS Duration:   QT Interval:  QTC Calculation:   R Axis:     Text Interpretation:         Laboratory Studies: Results for orders placed or performed during the hospital encounter of 01/10/21 (from the past 24 hour(s))  CBC with Differential     Status: Abnormal   Collection Time: 01/10/21 12:08 AM  Result Value Ref Range   WBC 8.1 4.0 - 10.5 K/uL   RBC 3.92 3.87 - 5.11 MIL/uL   Hemoglobin 7.8 (L) 12.0 - 15.0 g/dL   HCT 78.2 (L) 95.6 - 21.3 %   MCV 70.9 (L) 80.0 - 100.0 fL   MCH 19.9 (L) 26.0 - 34.0 pg   MCHC 28.1 (L) 30.0 - 36.0  g/dL   RDW 08.6 (H) 57.8 - 46.9 %   Platelets 224 150 - 400 K/uL   nRBC 0.0 0.0 - 0.2 %   Neutrophils Relative % 57 %   Neutro Abs 4.6 1.7 - 7.7 K/uL   Lymphocytes Relative 32 %   Lymphs Abs 2.6 0.7 - 4.0 K/uL   Monocytes Relative 8 %   Monocytes Absolute 0.6 0.1 - 1.0 K/uL   Eosinophils Relative 3 %   Eosinophils Absolute 0.3 0.0 - 0.5 K/uL   Basophils Relative 0 %   Basophils Absolute 0.0 0.0 - 0.1 K/uL   Immature Granulocytes 0 %   Abs Immature Granulocytes 0.02 0.00 - 0.07 K/uL  Comprehensive metabolic panel     Status: Abnormal   Collection Time: 01/10/21 12:08 AM  Result Value Ref Range   Sodium 137 135 - 145 mmol/L   Potassium 4.2 3.5 - 5.1 mmol/L   Chloride 104 98 - 111 mmol/L   CO2 28 22 - 32 mmol/L   Glucose, Bld 93 70 - 99 mg/dL   BUN 14 6 - 20 mg/dL   Creatinine, Ser 6.29 0.44 - 1.00 mg/dL   Calcium 8.5 (L) 8.9 - 10.3 mg/dL   Total Protein 7.5 6.5 - 8.1 g/dL   Albumin 3.6 3.5 - 5.0 g/dL   AST 16 15 - 41 U/L   ALT 12 0 - 44 U/L   Alkaline Phosphatase 82 38 - 126 U/L   Total Bilirubin 0.2 (L) 0.3 - 1.2 mg/dL   GFR, Estimated >52 >84 mL/min   Anion gap 5 5 - 15  hCG, serum, qualitative     Status: None   Collection Time: 01/10/21  2:03 AM  Result Value Ref Range   Preg, Serum NEGATIVE NEGATIVE   Imaging Studies: No results found.  ED COURSE and MDM  Nursing notes, initial and subsequent vitals signs, including pulse oximetry, reviewed and interpreted by myself.  Vitals:   01/09/21 2359 01/10/21 0000 01/10/21 0215  BP: (!) 173/93  (!) 175/77  Pulse: 66  (!) 58  Resp: 20  20  Temp: 98.3 F (36.8 C)    TempSrc: Oral    SpO2: 100%  100%  Weight:  136.1 kg   Height:  5\' 11"  (1.803 m)    Medications  ketorolac (TORADOL) 15 MG/ML injection 15 mg (has no administration in time range)   4:13 AM The patient is symptomatically anemic and she believes that she would benefit from a blood transfusion.  Dr. of OB/GYN was consulted and she advised the  patient should be admitted to the hospitalist service with OB/GYN consult.  She also recommend the patient be started on Megace 40 mg a day.  Dr. Para March accepts for admission to the hospitalist service.  PROCEDURES  Procedures   ED DIAGNOSES     ICD-10-CM   1. Abnormal vaginal bleeding  N93.9     2. Symptomatic anemia  D64.9          Jennet Scroggin, Jonny Ruiz, MD 01/10/21 [phone]

## 2021-01-11 DIAGNOSIS — I10 Essential (primary) hypertension: Secondary | ICD-10-CM | POA: Diagnosis not present

## 2021-01-11 DIAGNOSIS — D500 Iron deficiency anemia secondary to blood loss (chronic): Secondary | ICD-10-CM

## 2021-01-11 DIAGNOSIS — N939 Abnormal uterine and vaginal bleeding, unspecified: Secondary | ICD-10-CM | POA: Diagnosis not present

## 2021-01-11 DIAGNOSIS — D649 Anemia, unspecified: Secondary | ICD-10-CM | POA: Diagnosis not present

## 2021-01-11 LAB — CBC
HCT: 33.1 % — ABNORMAL LOW (ref 36.0–46.0)
Hemoglobin: 9.6 g/dL — ABNORMAL LOW (ref 12.0–15.0)
MCH: 21.3 pg — ABNORMAL LOW (ref 26.0–34.0)
MCHC: 29 g/dL — ABNORMAL LOW (ref 30.0–36.0)
MCV: 73.4 fL — ABNORMAL LOW (ref 80.0–100.0)
Platelets: 182 10*3/uL (ref 150–400)
RBC: 4.51 MIL/uL (ref 3.87–5.11)
RDW: 20.6 % — ABNORMAL HIGH (ref 11.5–15.5)
WBC: 8.3 10*3/uL (ref 4.0–10.5)
nRBC: 0 % (ref 0.0–0.2)

## 2021-01-11 LAB — TYPE AND SCREEN
ABO/RH(D): O POS
Antibody Screen: NEGATIVE
Unit division: 0
Unit division: 0

## 2021-01-11 LAB — BASIC METABOLIC PANEL
Anion gap: 5 (ref 5–15)
BUN: 12 mg/dL (ref 6–20)
CO2: 26 mmol/L (ref 22–32)
Calcium: 8.6 mg/dL — ABNORMAL LOW (ref 8.9–10.3)
Chloride: 106 mmol/L (ref 98–111)
Creatinine, Ser: 0.84 mg/dL (ref 0.44–1.00)
GFR, Estimated: >60 mL/min (ref >60)
Glucose, Bld: 96 mg/dL (ref 70–99)
Potassium: 3.8 mmol/L (ref 3.5–5.1)
Sodium: 137 mmol/L (ref 135–145)

## 2021-01-11 LAB — BPAM RBC
Blood Product Expiration Date: 202209282359
Blood Product Expiration Date: 202209282359
ISSUE DATE / TIME: 202208281622
ISSUE DATE / TIME: 202208281949
Unit Type and Rh: 5100
Unit Type and Rh: 5100

## 2021-01-11 LAB — GC/CHLAMYDIA PROBE AMP (~~LOC~~) NOT AT ARMC
Chlamydia: NEGATIVE
Comment: NEGATIVE
Comment: NORMAL
Neisseria Gonorrhea: NEGATIVE

## 2021-01-11 MED ORDER — AMLODIPINE BESYLATE 5 MG PO TABS
5.0000 mg | ORAL_TABLET | Freq: Every day | ORAL | Status: DC
  Administered 2021-01-11: 5 mg via ORAL
  Filled 2021-01-11: qty 1

## 2021-01-11 MED ORDER — MEGESTROL ACETATE 20 MG PO TABS: 20.0000 mg | ORAL_TABLET | Freq: Two times a day (BID) | ORAL | 1 refills | Status: AC

## 2021-01-11 MED ORDER — AMLODIPINE BESYLATE 5 MG PO TABS: 5.0000 mg | ORAL_TABLET | Freq: Every day | ORAL | 3 refills | Status: AC

## 2021-01-11 MED ORDER — ACETAMINOPHEN 325 MG PO TABS: 650.0000 mg | ORAL_TABLET | Freq: Four times a day (QID) | ORAL | 0 refills | Status: AC | PRN

## 2021-01-11 NOTE — Assessment & Plan Note (Signed)
Chronic. 

## 2021-01-11 NOTE — Progress Notes (Signed)
GYN PN:  S: Feeling much better this am.  Dizziness has resolved.  Bleeding has improved.  Feeling ready to go home today.  O: BP (!) 153/75 (BP Location: Left Arm)   Pulse 62   Temp 98.3 F (36.8 C) (Oral)   Resp 17   Ht 5\' 11"  (1.803 m)   Wt 136.1 kg   LMP 01/09/2021   SpO2 100%   BMI 41.84 kg/m   Gen: NAD CV: RRR  Lungs: CTAB Abd: obese, soft and non-tender Ext: no calf tenderness bilaterally, no edema Psych: normal mood and behavior  Results for orders placed or performed during the hospital encounter of 01/10/21 (from the past 24 hour(s))  Wet prep, genital     Status: Abnormal   Collection Time: 01/10/21 12:08 PM  Result Value Ref Range   Yeast Wet Prep HPF POC NONE SEEN NONE SEEN   Trich, Wet Prep NONE SEEN NONE SEEN   Clue Cells Wet Prep HPF POC NONE SEEN NONE SEEN   WBC, Wet Prep HPF POC MODERATE (A) NONE SEEN   Sperm NONE SEEN   Prepare RBC (crossmatch)     Status: None   Collection Time: 01/10/21  2:17 PM  Result Value Ref Range   Order Confirmation      ORDER PROCESSED BY BLOOD BANK Performed at [facility], [address] [place], [place]   Type and screen [facility]     Status: None (Preliminary result)   Collection Time: 01/10/21  2:17 PM  Result Value Ref Range   ABO/RH(D) O POS    Antibody Screen NEG    Sample Expiration 01/13/2021,2359    Unit Number 01/15/2021    Blood Component Type RED CELLS,LR    Unit division 00    Status of Unit ISSUED    Transfusion Status OK TO TRANSFUSE    Crossmatch Result      Compatible Performed at [facility], [address], [place], [place]    Unit Number [id number]    Blood Component Type RED CELLS,LR    Unit division 00    Status of Unit ISSUED    Transfusion Status OK TO TRANSFUSE    Crossmatch Result Compatible   HIV Antibody (routine testing w rflx)     Status: None   Collection Time: 01/10/21  3:14 PM  Result Value Ref Range   HIV Screen  4th Generation wRfx Non Reactive Non Reactive  ABO/Rh     Status: None   Collection Time: 01/10/21  3:14 PM  Result Value Ref Range   ABO/RH(D)      O POS Performed at [facility], [address] [place], [place]   Basic metabolic panel     Status: Abnormal   Collection Time: 01/11/21  3:50 AM  Result Value Ref Range   Sodium 137 135 - 145 mmol/L   Potassium 3.8 3.5 - 5.1 mmol/L   Chloride 106 98 - 111 mmol/L   CO2 26 22 - 32 mmol/L   Glucose, Bld 96 70 - 99 mg/dL   BUN 12 6 - 20 mg/dL   Creatinine, Ser 01/13/21 0.44 - 1.00 mg/dL   Calcium 8.6 (L) 8.9 - 10.3 mg/dL   GFR, Estimated 1.69 >67 mL/min   Anion gap 5 5 - 15  CBC     Status: Abnormal   Collection Time: 01/11/21  3:50 AM  Result Value Ref Range   WBC 8.3  4.0 - 10.5 K/uL   RBC 4.51 3.87 - 5.11 MIL/uL   Hemoglobin 9.6 (L) 12.0 - 15.0 g/dL   HCT 75.6 (L) 43.3 - 29.5 %   MCV 73.4 (L) 80.0 - 100.0 fL   MCH 21.3 (L) 26.0 - 34.0 pg   MCHC 29.0 (L) 30.0 - 36.0 g/dL   RDW 18.8 (H) 41.6 - 60.6 %   Platelets 182 150 - 400 K/uL   nRBC 0.0 0.0 - 0.2 %   A/P: 35yo admitted for symptomatic anemia -s/p 2upRBC, improvement of CBC, VSS, clinical symptoms resolved -AUB- for now continue on megace until her outpatient follow up -CHTN- stopped medication, plan to restart on Norvasc 5mg  daily, encouraged pt to f/u with PCP- recommendations to be given in discharge information  [facility] or pt may re-establish care with [facility].  Cherly Hensen, DO Attending Obstetrician & Gynecologist, [facility] for [facility], [facility]   -

## 2021-01-11 NOTE — Care Management (Signed)
Spoke to patient via phone.   Consult for PCP appointment in 2 weeks for HTN.  Patient has been to [facility] [phone] in the past.   NCM called same, patient has not been seen there since 2019. NCM was able to schedule an appointment January 27, 2021 at 0910 am. Patient , nurse and MD aware. Information placed on AVS.

## 2021-01-11 NOTE — Progress Notes (Signed)
PROGRESS NOTE    Bailey Villarreal  WHQ:[id number] DOB: 1985-06-06 DOA: 01/10/2021 PCP: Pcp, No   Brief Narrative: admitted on 01-10-2021 for dysfunction uterine bleeding and anemia. Transferred to GYN service.    Assessment & Plan:   Principal Problem:   Abnormal uterine bleeding Active Problems:   Essential hypertension   Anemia   Obesity, Class III, BMI 40-49.9 (morbid obesity) (HCC)   Essential hypertension Pt being discharged on norvasc. Pt was on HCTZ and lisinopril in the AM. Without proper followup and regular laboratory monitoring, continuing with HCTZ and ACEI is not recommended.  Pt will need new PCP appointment in 2 weeks for HTN followup.  If pt can reliably f/u with PCP, then restarting HCTZ and ACEI would be reasonably.  Have placed TOC(transition of care) consult to establish new PCP appointment in 2 weeks.  Abnormal uterine bleeding Being treated with 200 mg megace bid per GYN service. Pt being discharged today.  Anemia S/p 2 units PRBC transfusion. Defer iron therapy to GYN service.  Obesity, Class III, BMI 40-49.9 (morbid obesity) (HCC) Chronic.   Disposition Plan:  TOC to assist patient in getting f/u appointment in 2 weeks for HTN management. Pt states she is going to call Dr. Cherly Hensen office today for f/u appointment   Consultants:  none   Subjective: Feeling better after 2 units PRBC. Still noticing vaginal bleeding.    Asked by GYN service to assist patient in finding PCP office for her HTN.  Pt had previously seen Porfirio Oar, [name] with [facility] in [phone] for HTN control.  Pt states she is open to seeing new PCP or going back to prior PCP if available.  Objective: Vitals:   01/10/21 1939 01/10/21 2011 01/10/21 2322 01/11/21 0420  BP: (!) 142/59 137/68 125/75 (!) 153/75  Pulse: 68 68 69 62  Resp: 18 18 18 17   Temp: 98.8 F (37.1 C) 98.7 F (37.1 C) 98.4 F (36.9 C) 98.3 F (36.8 C)  TempSrc: Oral Oral  Oral Oral  SpO2: 100% 100% 100% 100%  Weight:      Height:        Intake/Output Summary (Last 24 hours) at 01/11/2021 0749 Last data filed at 01/11/2021 0600 Gross per 24 hour  Intake 620 ml  Output 4 ml  Net 616 ml   Filed Weights   01/10/21 0000  Weight: 136.1 kg    Examination:  Physical Exam Vitals and nursing note reviewed.  Constitutional:      General: She is not in acute distress.    Appearance: She is obese. She is not ill-appearing, toxic-appearing or diaphoretic.  HENT:     Head: Normocephalic and atraumatic.  Cardiovascular:     Rate and Rhythm: Normal rate and regular rhythm.  Pulmonary:     Effort: Pulmonary effort is normal.     Breath sounds: Normal breath sounds.  Skin:    General: Skin is warm and dry.     Capillary Refill: Capillary refill takes less than 2 seconds.  Neurological:     General: No focal deficit present.     Mental Status: She is alert.      Data Reviewed: I have personally reviewed following labs and imaging studies  CBC: Recent Labs  Lab 01/10/21 0008 01/11/21 0350  WBC 8.1 8.3  NEUTROABS 4.6  --   HGB 7.8* 9.6*  HCT 27.8* 33.1*  MCV 70.9* 73.4*  PLT 224 182   Basic Metabolic Panel: Recent Labs  Lab 01/10/21 0008 01/11/21 0350  NA 137 137  K 4.2 3.8  CL 104 106  CO2 28 26  GLUCOSE 93 96  BUN 14 12  CREATININE 0.73 0.84  CALCIUM 8.5* 8.6*   GFR: Estimated Creatinine Clearance: 140.3 mL/min (by C-G formula based on SCr of 0.84 mg/dL). Liver Function Tests: Recent Labs  Lab 01/10/21 0008  AST 16  ALT 12  ALKPHOS 82  BILITOT 0.2*  PROT 7.5  ALBUMIN 3.6   No results for input(s): LIPASE, AMYLASE in the last 168 hours. No results for input(s): AMMONIA in the last 168 hours. Coagulation Profile: No results for input(s): INR, PROTIME in the last 168 hours. Cardiac Enzymes: No results for input(s): CKTOTAL, CKMB, CKMBINDEX, TROPONINI in the last 168 hours. BNP (last 3 results) No results for input(s):  PROBNP in the last 8760 hours. HbA1C: No results for input(s): HGBA1C in the last 72 hours. CBG: No results for input(s): GLUCAP in the last 168 hours. Lipid Profile: No results for input(s): CHOL, HDL, LDLCALC, TRIG, CHOLHDL, LDLDIRECT in the last 72 hours. Thyroid Function Tests: No results for input(s): TSH, T4TOTAL, FREET4, T3FREE, THYROIDAB in the last 72 hours. Anemia Panel: No results for input(s): VITAMINB12, FOLATE, FERRITIN, TIBC, IRON, RETICCTPCT in the last 72 hours. Sepsis Labs: No results for input(s): PROCALCITON, LATICACIDVEN in the last 168 hours.  Recent Results (from the past 240 hour(s))  Resp Panel by RT-PCR (Flu A&B, Covid) Nasopharyngeal Swab     Status: None   Collection Time: 01/10/21  4:31 AM   Specimen: Nasopharyngeal Swab; Nasopharyngeal(NP) swabs in vial transport medium  Result Value Ref Range Status   SARS Coronavirus 2 by RT PCR NEGATIVE NEGATIVE Final    Comment: (NOTE) SARS-CoV-2 target nucleic acids are NOT DETECTED.  The SARS-CoV-2 RNA is generally detectable in upper respiratory specimens during the acute phase of infection. The lowest concentration of SARS-CoV-2 viral copies this assay can detect is 138 copies/mL. A negative result does not preclude SARS-Cov-2 infection and should not be used as the sole basis for treatment or other patient management decisions. A negative result may occur with  improper specimen collection/handling, submission of specimen other than nasopharyngeal swab, presence of viral mutation(s) within the areas targeted by this assay, and inadequate number of viral copies(<138 copies/mL). A negative result must be combined with clinical observations, patient history, and epidemiological information. The expected result is Negative.  Fact Sheet for Patients:  BloggerCourse.com  Fact Sheet for Healthcare Providers:  SeriousBroker.it  This test is no t yet approved or  cleared by the [place] FDA and  has been authorized for detection and/or diagnosis of SARS-CoV-2 by FDA under an Emergency Use Authorization (EUA). This EUA will remain  in effect (meaning this test can be used) for the duration of the COVID-19 declaration under Section 564(b)(1) of the Act, 21 U.S.C.section 360bbb-3(b)(1), unless the authorization is terminated  or revoked sooner.       Influenza A by PCR NEGATIVE NEGATIVE Final   Influenza B by PCR NEGATIVE NEGATIVE Final    Comment: (NOTE) The Xpert Xpress SARS-CoV-2/FLU/RSV plus assay is intended as an aid in the diagnosis of influenza from Nasopharyngeal swab specimens and should not be used as a sole basis for treatment. Nasal washings and aspirates are unacceptable for Xpert Xpress SARS-CoV-2/FLU/RSV testing.  Fact Sheet for Patients: BloggerCourse.com  Fact Sheet for Healthcare Providers: SeriousBroker.it  This test is not yet approved or cleared by the [place] FDA and has  been authorized for detection and/or diagnosis of SARS-CoV-2 by FDA under an Emergency Use Authorization (EUA). This EUA will remain in effect (meaning this test can be used) for the duration of the COVID-19 declaration under Section 564(b)(1) of the Act, 21 U.S.C. section 360bbb-3(b)(1), unless the authorization is terminated or revoked.  Performed at [facility], [address] [place], [place] [address]   Wet prep, genital     Status: Abnormal   Collection Time: 01/10/21 12:08 PM  Result Value Ref Range Status   Yeast Wet Prep HPF POC NONE SEEN NONE SEEN Final   Trich, Wet Prep NONE SEEN NONE SEEN Final   Clue Cells Wet Prep HPF POC NONE SEEN NONE SEEN Final   WBC, Wet Prep HPF POC MODERATE (A) NONE SEEN Final   Sperm NONE SEEN  Final    Comment: Performed at [facility], [address] [place], [place] [address]         Radiology Studies: US PELVIC  COMPLETE WITH TRANSVAGINAL  Result Date: 01/10/2021 CLINICAL DATA:  Dysfunctional uterine bleeding. EXAM: TRANSABDOMINAL AND TRANSVAGINAL ULTRASOUND OF PELVIS TECHNIQUE: Both transabdominal and transvaginal ultrasound examinations of the pelvis were performed. Transabdominal technique was performed for global imaging of the pelvis including uterus, ovaries, adnexal regions, and pelvic cul-de-sac. It was necessary to proceed with endovaginal exam following the transabdominal exam to visualize the endometrium and ovaries. COMPARISON:  None FINDINGS: Uterus Measurements: 9.6 x 3.7 x 4.7 cm = volume: 86.8 mL. No fibroids or other mass visualized. Endometrium Thickness: 6.4 mm. The 6.4 mm measurement of the endometrial stripe complex includes 2 mm of fluid in the canal. Right ovary Measurements: 3.1 x 1.5 x 1.9 cm = volume: 4.6 mL. Normal appearance/no adnexal mass. Left ovary Measurements: 2.0 x 1.5 x 1.4 cm = volume: 2.2 mL. Normal appearance/no adnexal mass. Other findings Trace fluid in the cul-de-sac. IMPRESSION: 1. The endometrial stripe complex measures 6.4 mm including a small amount of fluid in the canal. The endometrial tissue itself measures approximately 4.4 mm. If bleeding remains unresponsive to hormonal or medical therapy, sonohysterogram should be considered for focal lesion work-up. (Ref: Radiological Reasoning: Algorithmic Workup of Abnormal Vaginal Bleeding with Endovaginal Sonography and Sonohysterography. AJR 2008; 130:Q65-78) 2. Trace fluid in the cul-de-sac is likely physiologic. Electronically Signed   By: Gerome Sam III M.D.   On: 01/10/2021 13:40        Scheduled Meds:  amLODipine  5 mg Oral Daily   megestrol  20 mg Oral BID   Continuous Infusions:   LOS: 0 days    Time spent: 20 mins in chart review, talking and examining patient.   Carollee Herter, DO [facility]  If 7PM-7AM, please contact night-coverage [email or web address] 01/11/2021, 7:49 AM

## 2021-01-11 NOTE — Assessment & Plan Note (Signed)
Pt being discharged on norvasc. Pt was on HCTZ and lisinopril in the AM. Without proper followup and regular laboratory monitoring, continuing with HCTZ and ACEI is not recommended.  Pt will need new PCP appointment in 2 weeks for HTN followup.  If pt can reliably f/u with PCP, then restarting HCTZ and ACEI would be reasonably.  Have placed TOC(transition of care) consult to establish new PCP appointment in 2 weeks.

## 2021-01-11 NOTE — Assessment & Plan Note (Signed)
Being treated with 200 mg megace bid per GYN service. Pt being discharged today.

## 2021-01-11 NOTE — Progress Notes (Signed)
Ezekiel Ina to be D/C'd  per MD order.  Discussed with the patient and all questions fully answered.  VSS, Skin clean, dry and intact without evidence of skin break down, no evidence of skin tears noted.  IV catheter discontinued intact. Site without signs and symptoms of complications. Dressing and pressure applied.  An After Visit Summary was printed and given to the patient. Patient will pick prescription up at [facility].  Patient instructed to return to ED, call 911, or call MD for any changes in condition.   Patient to be escorted via WC, and D/C home via private auto.

## 2021-01-11 NOTE — Progress Notes (Signed)
Phlebotomist is having a hard time drawing blood from the pt. Will have them try it again.

## 2021-01-11 NOTE — Assessment & Plan Note (Signed)
S/p 2 units PRBC transfusion. Defer iron therapy to GYN service.

## 2021-01-12 NOTE — Discharge Summary (Signed)
Physician Discharge Summary  Patient ID: Bailey Villarreal MRN: [id number] DOB/AGE: 1986/03/20 35 y.o.  Admit date: 01/10/2021 Discharge date: 01/11/2021  Admission Diagnoses: Symptomatic Anemia, Abnormal uterine bleeding  Discharge Diagnoses:  Principal Problem:   Abnormal uterine bleeding Active Problems:   Anemia   Obesity, Class III, BMI 40-49.9 (morbid obesity) (HCC)   Essential hypertension   Discharged Condition: stable  Hospital Course: 35yo G2P0 who presented due to worsening of her periods, dizziness and fatigue.  Hgb noted to be 7.8.  Pt was kept for transfusion of 2upRBC and started on Megace. [place] was also completed see below.  Bleeding improved and pt feeling much improved. Plan for discharge home with outpatient follow up to discuss long term management of her menses.  Consults: None  Significant Diagnostic Studies: labs:  CBC Latest Ref Rng & Units 01/11/2021 01/10/2021 05/16/2018  WBC 4.0 - 10.5 K/uL 8.3 8.1 9.6  Hemoglobin 12.0 - 15.0 g/dL 4.0(J) 7.8(L) 9.6(L)  Hematocrit 36.0 - 46.0 % 33.1(L) 27.8(L) 33.5(L)  Platelets 150 - 400 K/uL 182 224 193   [place] 9.6cm uterus with no fibroids or masses. 6.32mm endometrium.  Normal ovaries bilaterally   Treatments: IV hydration, transfusion 2upRBC  Discharge Exam: Blood pressure (!) 149/72, pulse 65, temperature 99.3 F (37.4 C), temperature source Oral, resp. rate 19, height 5\' 11"  (1.803 m), weight 136.1 kg, last menstrual period 01/09/2021, SpO2 100 %. Gen: NAD CV: RRR          Lungs: CTAB Abd: obese, soft and non-tender Ext: no calf tenderness bilaterally, no edema Psych: normal mood and behavior  Disposition: Discharge disposition: 01-Home or Self Care        Allergies as of 01/11/2021   No Known Allergies      Medication List     TAKE these medications    acetaminophen 325 MG tablet Commonly known as: TYLENOL Take 2 tablets (650 mg total) by mouth every 6 (six) hours as needed for mild pain (or Fever >/=  101).   amLODipine 5 MG tablet Commonly known as: NORVASC Take 1 tablet (5 mg total) by mouth daily.   megestrol 20 MG tablet Commonly known as: MEGACE Take 1 tablet (20 mg total) by mouth 2 (two) times daily.   meloxicam 15 MG tablet Commonly known as: MOBIC Take 15 mg by mouth daily.   oxyCODONE-acetaminophen 5-325 MG tablet Commonly known as: PERCOCET/ROXICET Take 1 tablet by mouth every 4 (four) hours as needed for pain.   PERCOGESIC PO Take 2 tablets by mouth 2 (two) times daily as needed (pain).        Follow-up Information     [facility] Follow up in 2 week(s).   Specialty: Obstetrics and Gynecology Why: Please follow-up at the next available in our office or with Dr. [facility] information: [phone] [place] [phone]        [facility] Follow up.   Specialty: Family Medicine Why: September 14. 2022 0910 am Contact information: [address] [place] [phone]                 Signed: [phone] 01/12/2021, 7:16 AM

## 2021-01-19 DIAGNOSIS — Z124 Encounter for screening for malignant neoplasm of cervix: Secondary | ICD-10-CM | POA: Diagnosis not present

## 2021-01-19 DIAGNOSIS — N938 Other specified abnormal uterine and vaginal bleeding: Secondary | ICD-10-CM | POA: Diagnosis not present

## 2021-01-19 DIAGNOSIS — N858 Other specified noninflammatory disorders of uterus: Secondary | ICD-10-CM | POA: Diagnosis not present

## 2021-05-19 ENCOUNTER — Emergency Department (HOSPITAL_BASED_OUTPATIENT_CLINIC_OR_DEPARTMENT_OTHER)
Admission: EM | Admit: 2021-05-19 | Discharge: 2021-05-20 | Disposition: A | Discharge status: Home / Self Care | Payer: BC Managed Care – PPO | Attending: Emergency Medicine | Admitting: Emergency Medicine

## 2021-05-19 ENCOUNTER — Encounter (HOSPITAL_BASED_OUTPATIENT_CLINIC_OR_DEPARTMENT_OTHER): Payer: Self-pay | Admitting: *Deleted

## 2021-05-19 ENCOUNTER — Emergency Department (HOSPITAL_BASED_OUTPATIENT_CLINIC_OR_DEPARTMENT_OTHER): Payer: BC Managed Care – PPO

## 2021-05-19 ENCOUNTER — Other Ambulatory Visit: Payer: Self-pay

## 2021-05-19 DIAGNOSIS — Z79899 Other long term (current) drug therapy: Secondary | ICD-10-CM | POA: Insufficient documentation

## 2021-05-19 DIAGNOSIS — M62838 Other muscle spasm: Secondary | ICD-10-CM | POA: Diagnosis not present

## 2021-05-19 DIAGNOSIS — J45909 Unspecified asthma, uncomplicated: Secondary | ICD-10-CM | POA: Diagnosis not present

## 2021-05-19 DIAGNOSIS — R0789 Other chest pain: Secondary | ICD-10-CM | POA: Diagnosis not present

## 2021-05-19 DIAGNOSIS — I10 Essential (primary) hypertension: Secondary | ICD-10-CM | POA: Diagnosis not present

## 2021-05-19 DIAGNOSIS — R072 Precordial pain: Secondary | ICD-10-CM | POA: Diagnosis not present

## 2021-05-19 LAB — CBC
HCT: 32 % — ABNORMAL LOW (ref 36.0–46.0)
Hemoglobin: 9.6 g/dL — ABNORMAL LOW (ref 12.0–15.0)
MCH: 23 pg — ABNORMAL LOW (ref 26.0–34.0)
MCHC: 30 g/dL (ref 30.0–36.0)
MCV: 76.7 fL — ABNORMAL LOW (ref 80.0–100.0)
Platelets: 247 10*3/uL (ref 150–400)
RBC: 4.17 MIL/uL (ref 3.87–5.11)
RDW: 16.2 % — ABNORMAL HIGH (ref 11.5–15.5)
WBC: 8.9 10*3/uL (ref 4.0–10.5)
nRBC: 0 % (ref 0.0–0.2)

## 2021-05-19 LAB — BASIC METABOLIC PANEL
Anion gap: 11 (ref 5–15)
BUN: 13 mg/dL (ref 6–20)
CO2: 23 mmol/L (ref 22–32)
Calcium: 8.9 mg/dL (ref 8.9–10.3)
Chloride: 103 mmol/L (ref 98–111)
Creatinine, Ser: 0.8 mg/dL (ref 0.44–1.00)
GFR, Estimated: >60 mL/min (ref >60)
Glucose, Bld: 103 mg/dL — ABNORMAL HIGH (ref 70–99)
Potassium: 3.9 mmol/L (ref 3.5–5.1)
Sodium: 137 mmol/L (ref 135–145)

## 2021-05-19 LAB — TROPONIN I (HIGH SENSITIVITY): Troponin I (High Sensitivity): 5 ng/L (ref <18)

## 2021-05-19 NOTE — ED Triage Notes (Signed)
C/o mid sternal chest pain x 1 week  

## 2021-05-20 MED ORDER — KETOROLAC TROMETHAMINE 60 MG/2ML IM SOLN
30.0000 mg | Freq: Once | INTRAMUSCULAR | Status: AC
  Administered 2021-05-20: 30 mg via INTRAMUSCULAR
  Filled 2021-05-20: qty 2

## 2021-05-20 NOTE — Discharge Instructions (Signed)
You may use over-the-counter Motrin (Ibuprofen), Acetaminophen (Tylenol), topical muscle creams such as SalonPas, Icy Hot, Bengay, etc. Please stretch, apply ice or heat (whichever helps), and have massage therapy for additional assistance.  

## 2021-05-20 NOTE — ED Provider Notes (Signed)
[facility] EMERGENCY DEPARTMENT Provider Note  CSN: [id number] Arrival date & time: 05/19/21 2217  Chief Complaint(s) Chest Pain  HPI Bailey Villarreal is a 36 y.o. female    Chest Pain Pain location:  R chest Pain quality: sharp and stabbing   Pain severity:  Moderate Onset quality:  Gradual Duration:  1 week Timing:  Constant Progression:  Waxing and waning Chronicity:  New Worsened by:  Certain positions and movement Associated symptoms: back pain (right upper back)   Associated symptoms: no cough, no fever, no nausea, no shortness of breath and no vomiting    Past Medical History Past Medical History:  Diagnosis Date   Anemia    Asthma    DUB (dysfunctional uterine bleeding)    Morbid obesity (HCC)    Transient ischemic attack (TIA)    Venous stasis    Patient Active Problem List   Diagnosis Date Noted   Abnormal uterine bleeding 01/10/2021   Anemia 01/10/2021   Obesity, Class III, BMI 40-49.9 (morbid obesity) ([place]) 01/10/2021   Closed nondisplaced fracture of cuboid bone of left foot 09/18/2020   Peroneal tendinitis of left lower extremity 08/26/2020   Contusion of left hand 08/26/2020   Essential hypertension 09/18/2017   Home Medication(s) Prior to Admission medications   Medication Sig Start Date End Date Taking? Authorizing Provider  acetaminophen (TYLENOL) 325 MG tablet Take 2 tablets (650 mg total) by mouth every 6 (six) hours as needed for mild pain (or Fever >/= 101). 01/11/21   Ozan, Jennifer, DO  amLODipine (NORVASC) 5 MG tablet Take 1 tablet (5 mg total) by mouth daily. 01/11/21 02/10/21  Janyth Pupa, DO  diphenhydrAMINE-Acetaminophen (PERCOGESIC PO) Take 2 tablets by mouth 2 (two) times daily as needed (pain).    [provider]  meloxicam (MOBIC) 15 MG tablet Take 15 mg by mouth daily. 12/14/20   [provider]  oxyCODONE-acetaminophen (PERCOCET/ROXICET) 5-325 MG tablet Take 1 tablet by mouth every 4 (four) hours as needed  for pain. 12/14/20   [provider]                                                                                                                                    Allergies Patient has no known allergies.  Review of Systems Review of Systems  Constitutional:  Negative for fever.  Respiratory:  Negative for cough and shortness of breath.   Cardiovascular:  Positive for chest pain.  Gastrointestinal:  Negative for nausea and vomiting.  Musculoskeletal:  Positive for back pain (right upper back).  As noted in HPI  Physical Exam Vital Signs  I have reviewed the triage vital signs BP (!) 155/85 (BP Location: Left Arm)    Pulse 71    Temp 98 F (36.7 C) (Oral)    Resp 16    Ht 5\' 11"  (1.803 m)    Wt 124.7 kg    LMP 04/29/2021  SpO2 100%    BMI 38.35 kg/m   Physical Exam Vitals reviewed.  Constitutional:      General: She is not in acute distress.    Appearance: She is well-developed. She is obese. She is not diaphoretic.  HENT:     Head: Normocephalic and atraumatic.     Nose: Nose normal.  Eyes:     General: No scleral icterus.       Right eye: No discharge.        Left eye: No discharge.     Conjunctiva/sclera: Conjunctivae normal.     Pupils: Pupils are equal, round, and reactive to light.  Cardiovascular:     Rate and Rhythm: Normal rate and regular rhythm.     Heart sounds: No murmur heard.   No friction rub. No gallop.  Pulmonary:     Effort: Pulmonary effort is normal. No respiratory distress.     Breath sounds: Normal breath sounds. No stridor. No rales.  Chest:     Chest wall: Tenderness present.    Abdominal:     General: There is no distension.     Palpations: Abdomen is soft.     Tenderness: There is no abdominal tenderness.  Musculoskeletal:     Cervical back: Normal range of motion and neck supple.     Thoracic back: Spasms and tenderness present.       Back:  Skin:    General: Skin is warm and dry.     Findings: No erythema or rash.   Neurological:     Mental Status: She is alert and oriented to person, place, and time.    ED Results and Treatments Labs (all labs ordered are listed, but only abnormal results are displayed) Labs Reviewed  BASIC METABOLIC PANEL - Abnormal; Notable for the following components:      Result Value   Glucose, Bld 103 (*)    All other components within normal limits  CBC - Abnormal; Notable for the following components:   Hemoglobin 9.6 (*)    HCT 32.0 (*)    MCV 76.7 (*)    MCH 23.0 (*)    RDW 16.2 (*)    All other components within normal limits  TROPONIN I (HIGH SENSITIVITY)  TROPONIN I (HIGH SENSITIVITY)                                                                                                                         EKG  EKG Interpretation  Date/Time:  Wednesday May 19 2021 22:23:32 EST Ventricular Rate:  63 PR Interval:  180 QRS Duration: 82 QT Interval:  424 QTC Calculation: 433 R Axis:   86 Text Interpretation: Normal sinus rhythm Nonspecific T wave abnormality Abnormal ECG No acute changes When compared with ECG of 28-Apr-2016 00:20, PREVIOUS ECG IS PRESENT Confirmed by Addison Lank [phone]) on 05/20/2021 12:10:17 AM       Radiology DG Chest 2 View  Result Date: 05/19/2021 CLINICAL DATA:  Midsternal chest  pain. EXAM: CHEST - 2 VIEW COMPARISON:  Chest x-ray 04/09/2017. FINDINGS: The heart size and mediastinal contours are within normal limits. Both lungs are clear. The visualized skeletal structures are unremarkable. IMPRESSION: No active cardiopulmonary disease. Electronically Signed   By: Ronney Asters M.D.   On: 05/19/2021 22:39    Pertinent labs & imaging results that were available during my care of the patient were reviewed by me and considered in my medical decision making (see MDM for details).  Medications Ordered in ED Medications  ketorolac (TORADOL) injection 30 mg (has no administration in time range)                                                                                                                                      Procedures Procedures  (including critical care time)  Medical Decision Making / ED Course     1 week of right upper chest and right upper back pain. Constant since onset. Clinical presentation is most consistent with muscle strain/spasm of the right shoulder girdle musculature. Highly inconsistent with ACS.  During triage process cardiac work-up was obtained.  My independent evaluation of the work-up noted below: EKG without acute ischemic changes or evidence of pericarditis. Initial troponin negative. CBC without leukocytosis. Stable anemia. No significant electrolyte derangements or renal sufficiency.  On my read of the chest x-ray, there was no evidence suggestive of pneumonia, pneumothorax, pneumomediastinum, pulmonary edema concerning for new or exacerbation of heart failure, abnormal contour of the mediastinum to suggest dissection, and no evidence of acute injuries.  Given the constant nature of the patient's pain and atypical features, I do not feel that she requires a second troponin to rule out ACS.  Presentation is not classic for arctic dissection or esophageal perforation. Low suspicion for pulmonary embolism.     Final Clinical Impression(s) / ED Diagnoses Final diagnoses:  Muscle spasm of right shoulder  Chest wall pain  The patient appears reasonably screened and/or stabilized for discharge and I doubt any other medical condition or other [facility] requiring further screening, evaluation, or treatment in the ED at this time prior to discharge. Safe for discharge with strict return precautions.  Disposition: Discharge  Condition: Good  I have discussed the results, Dx and Tx plan with the patient/family who expressed understanding and agree(s) with the plan. Discharge instructions discussed at length. The patient/family was given strict return precautions who verbalized  understanding of the instructions. No further questions at time of discharge.    ED Discharge Orders     None        Follow Up: [place] [address]  [address] [place] [address] [phone]  Call  to schedule an appointment for close follow up, if symptoms do not improve or  worsen            This chart was dictated using voice recognition software.  Despite best efforts to proofread,  errors can occur which can change the documentation meaning.    Fatima Blank, MD 05/20/21 [phone]

## 2021-06-24 DIAGNOSIS — F4323 Adjustment disorder with mixed anxiety and depressed mood: Secondary | ICD-10-CM | POA: Diagnosis not present

## 2021-07-08 DIAGNOSIS — F4323 Adjustment disorder with mixed anxiety and depressed mood: Secondary | ICD-10-CM | POA: Diagnosis not present

## 2021-07-22 DIAGNOSIS — F4323 Adjustment disorder with mixed anxiety and depressed mood: Secondary | ICD-10-CM | POA: Diagnosis not present

## 2021-08-05 DIAGNOSIS — F4323 Adjustment disorder with mixed anxiety and depressed mood: Secondary | ICD-10-CM | POA: Diagnosis not present

## 2021-08-19 DIAGNOSIS — F4323 Adjustment disorder with mixed anxiety and depressed mood: Secondary | ICD-10-CM | POA: Diagnosis not present

## 2021-10-14 DIAGNOSIS — F4323 Adjustment disorder with mixed anxiety and depressed mood: Secondary | ICD-10-CM | POA: Diagnosis not present

## 2021-11-04 DIAGNOSIS — F4323 Adjustment disorder with mixed anxiety and depressed mood: Secondary | ICD-10-CM | POA: Diagnosis not present

## 2021-12-29 IMAGING — US US PELVIS COMPLETE WITH TRANSVAGINAL
1 series · 13 of 25 positions shown · non-contrast
Comparison: None

CLINICAL DATA: Dysfunctional uterine bleeding.

EXAM:
TRANSABDOMINAL AND TRANSVAGINAL ULTRASOUND OF PELVIS
TECHNIQUE: Both transabdominal and transvaginal ultrasound examinations of the
pelvis were performed. Transabdominal technique was performed for
global imaging of the pelvis including uterus, ovaries, adnexal
regions, and pelvic cul-de-sac. It was necessary to proceed with
endovaginal exam following the transabdominal exam to visualize the
endometrium and ovaries.

[Series 1: us pelvic complete with transvaginal · 13 of 58 slices shown]
[im 1/58]
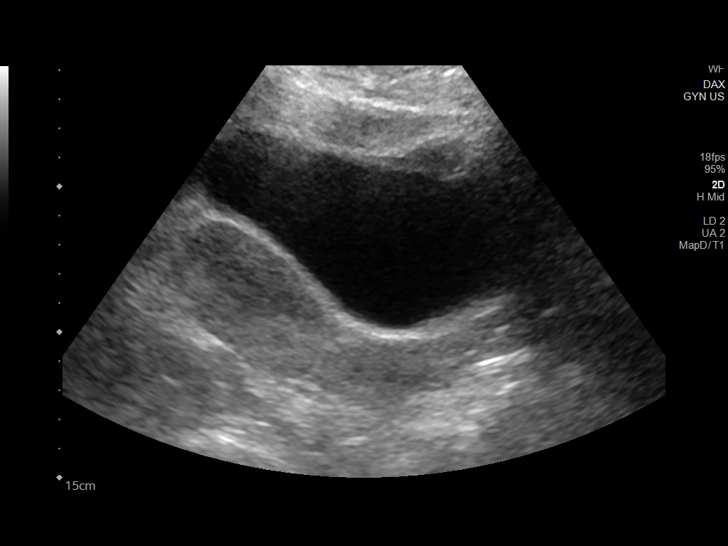
[im 5/58]
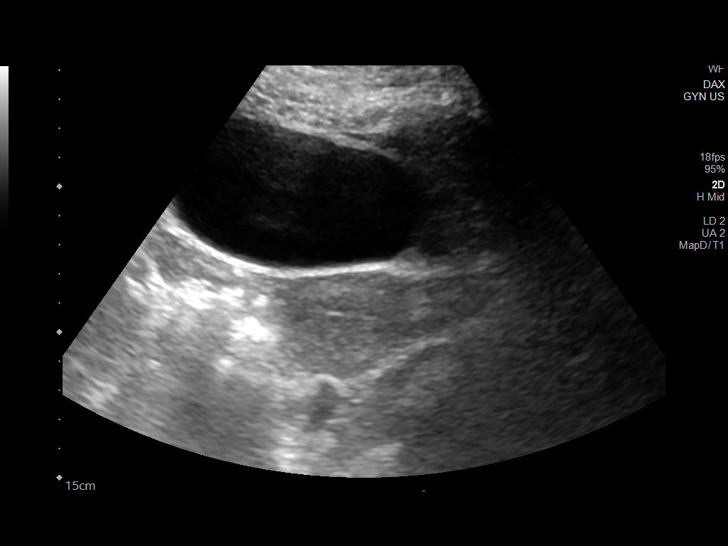
[im 10/58]
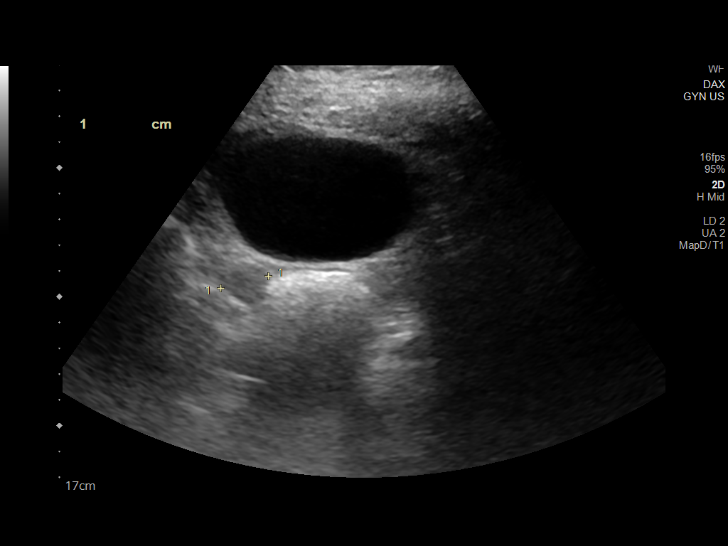
[im 15/58]
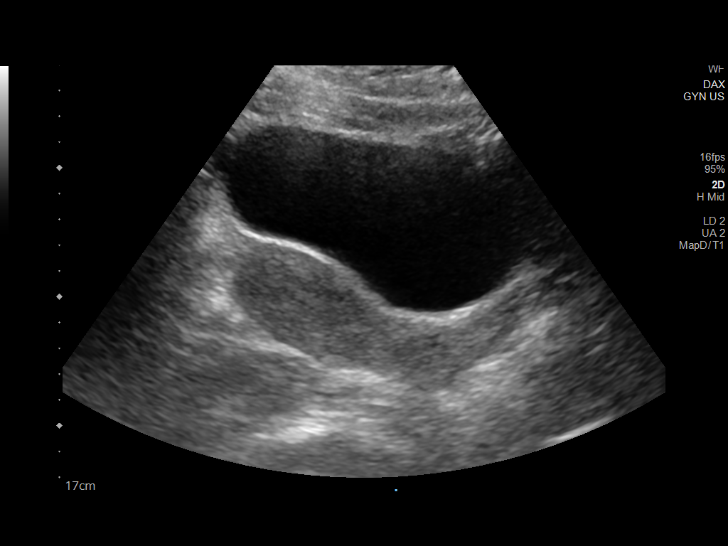
[im 20/58]
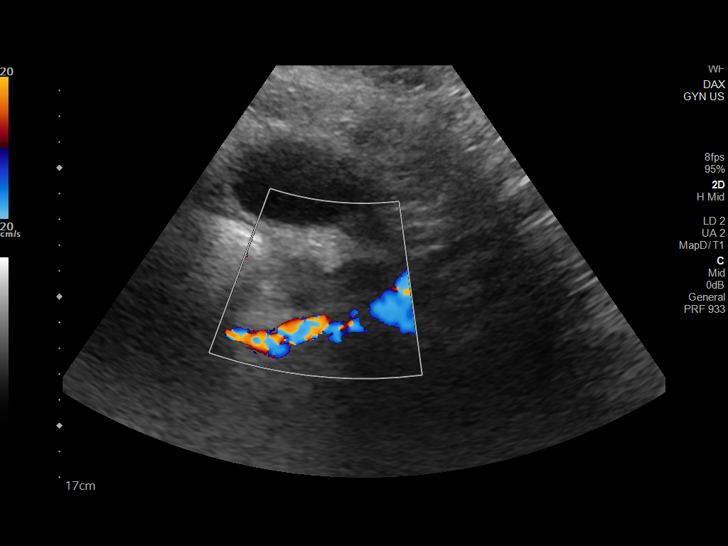
[im 24/58]
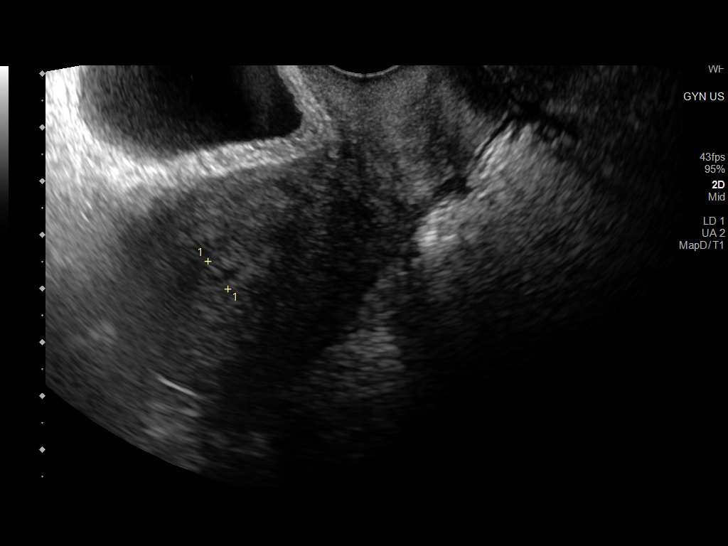
[im 29/58]
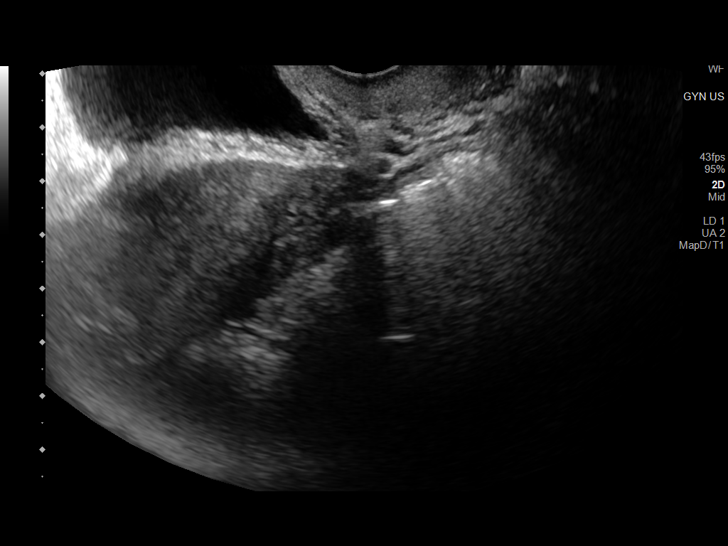
[im 34/58]
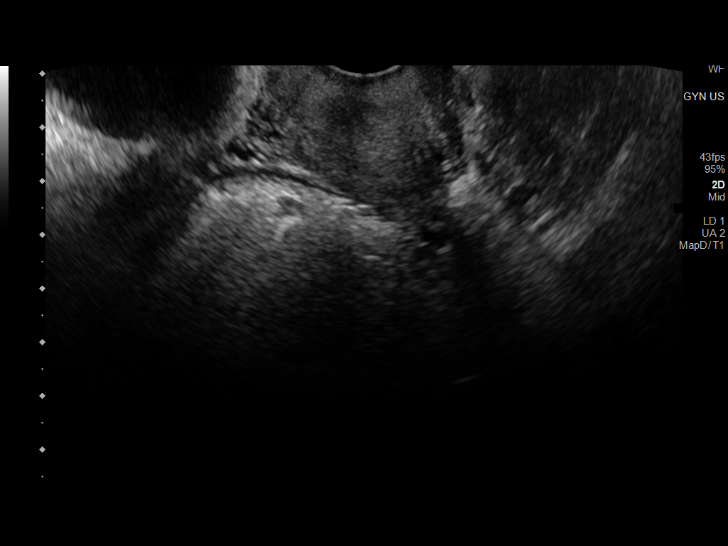
[im 39/58]
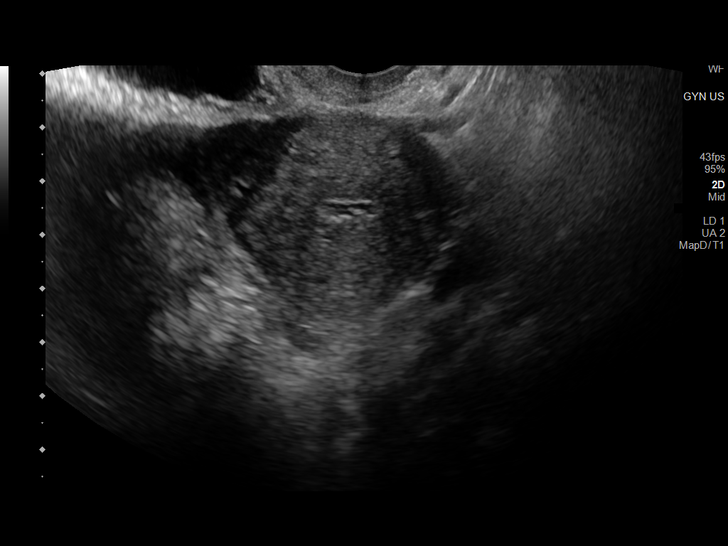
[im 43/58]
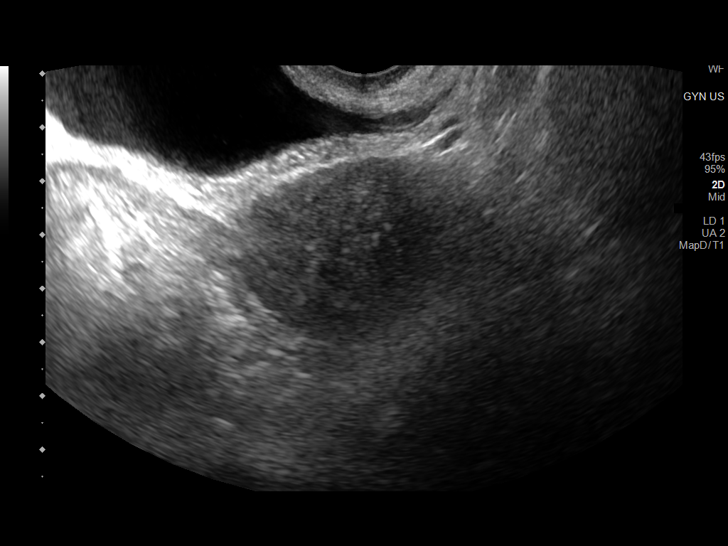
[im 48/58]
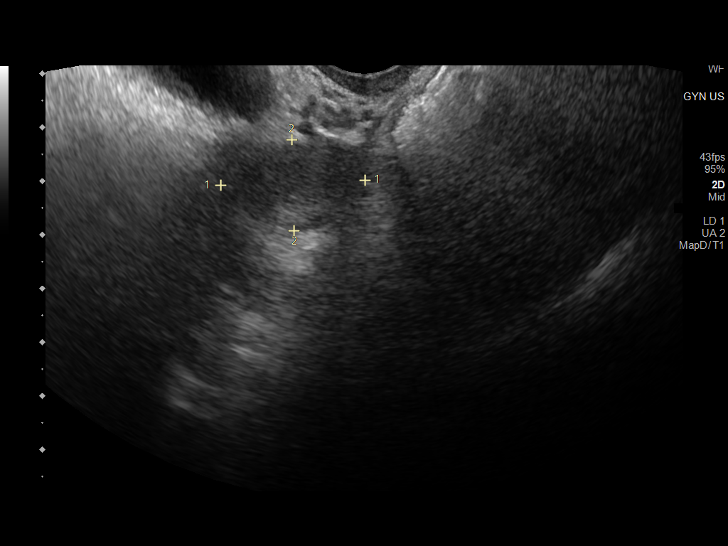
[im 53/58]
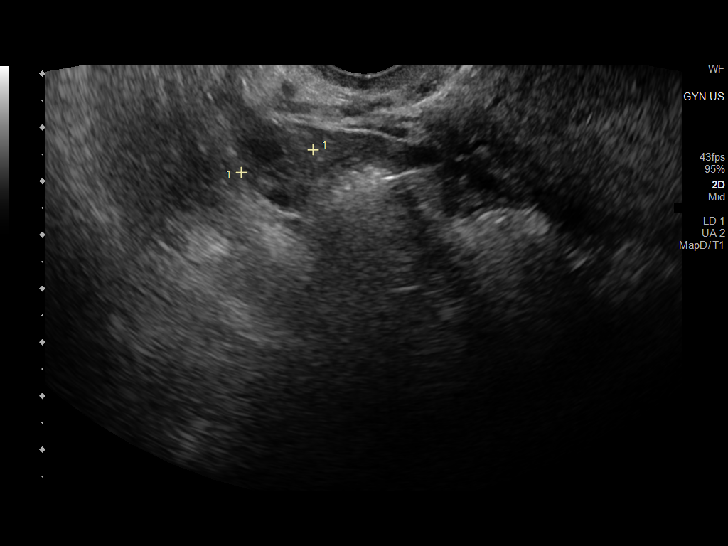
[im 58/58]
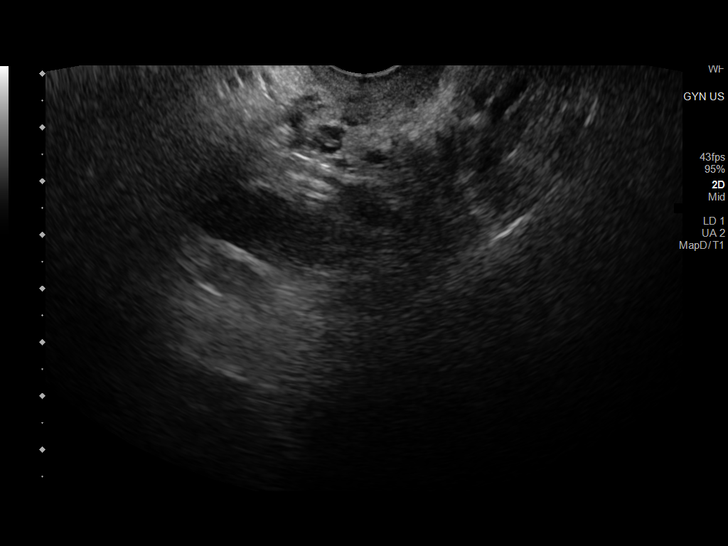

[13 of 25 positions shown; findings below may reference images not displayed]

FINDINGS: Uterus

Measurements: 9.6 x 3.7 x 4.7 cm = volume: 86.8 mL. No fibroids or
other mass visualized.

Endometrium

Thickness: 6.4 mm. The 6.4 mm measurement of the endometrial stripe
complex includes 2 mm of fluid in the canal.

Right ovary

Measurements: 3.1 x 1.5 x 1.9 cm = volume: 4.6 mL. Normal
appearance/no adnexal mass.

Left ovary

Measurements: 2.0 x 1.5 x 1.4 cm = volume: 2.2 mL. Normal
appearance/no adnexal mass.

Other findings

Trace fluid in the cul-de-sac.
IMPRESSION: 1. The endometrial stripe complex measures 6.4 mm including a small
amount of fluid in the canal. The endometrial tissue itself measures
approximately 4.4 mm. If bleeding remains unresponsive to hormonal
or medical therapy, sonohysterogram should be considered for focal
lesion work-up. (Ref: Radiological Reasoning: Algorithmic Workup of
Abnormal Vaginal Bleeding with Endovaginal Sonography and
Sonohysterography. AJR [id number]; 191:S68-73)
2. Trace fluid in the cul-de-sac is likely physiologic.

## 2022-03-30 DIAGNOSIS — Z903 Acquired absence of stomach [part of]: Secondary | ICD-10-CM | POA: Diagnosis not present

## 2022-03-30 DIAGNOSIS — K912 Postsurgical malabsorption, not elsewhere classified: Secondary | ICD-10-CM | POA: Diagnosis not present

## 2022-03-30 DIAGNOSIS — R12 Heartburn: Secondary | ICD-10-CM | POA: Diagnosis not present

## 2022-04-01 DIAGNOSIS — F330 Major depressive disorder, recurrent, mild: Secondary | ICD-10-CM | POA: Diagnosis not present

## 2022-04-11 DIAGNOSIS — K9589 Other complications of other bariatric procedure: Secondary | ICD-10-CM | POA: Diagnosis not present

## 2022-04-11 DIAGNOSIS — R12 Heartburn: Secondary | ICD-10-CM | POA: Diagnosis not present

## 2022-04-11 DIAGNOSIS — J45909 Unspecified asthma, uncomplicated: Secondary | ICD-10-CM | POA: Diagnosis not present

## 2022-04-11 DIAGNOSIS — K3189 Other diseases of stomach and duodenum: Secondary | ICD-10-CM | POA: Diagnosis not present

## 2022-04-12 DIAGNOSIS — Z713 Dietary counseling and surveillance: Secondary | ICD-10-CM | POA: Diagnosis not present

## 2022-04-12 DIAGNOSIS — R12 Heartburn: Secondary | ICD-10-CM | POA: Diagnosis not present

## 2022-04-14 DIAGNOSIS — Z903 Acquired absence of stomach [part of]: Secondary | ICD-10-CM | POA: Diagnosis not present

## 2022-04-14 DIAGNOSIS — K9189 Other postprocedural complications and disorders of digestive system: Secondary | ICD-10-CM | POA: Diagnosis not present

## 2022-04-14 DIAGNOSIS — Z01818 Encounter for other preprocedural examination: Secondary | ICD-10-CM | POA: Diagnosis not present

## 2022-04-14 DIAGNOSIS — K219 Gastro-esophageal reflux disease without esophagitis: Secondary | ICD-10-CM | POA: Diagnosis not present

## 2022-04-14 DIAGNOSIS — R12 Heartburn: Secondary | ICD-10-CM | POA: Diagnosis not present

## 2022-04-14 DIAGNOSIS — K912 Postsurgical malabsorption, not elsewhere classified: Secondary | ICD-10-CM | POA: Diagnosis not present

## 2022-04-19 DIAGNOSIS — R12 Heartburn: Secondary | ICD-10-CM | POA: Diagnosis not present

## 2022-04-19 DIAGNOSIS — R0602 Shortness of breath: Secondary | ICD-10-CM | POA: Diagnosis not present

## 2022-04-20 DIAGNOSIS — K912 Postsurgical malabsorption, not elsewhere classified: Secondary | ICD-10-CM | POA: Diagnosis not present

## 2022-04-20 DIAGNOSIS — R12 Heartburn: Secondary | ICD-10-CM | POA: Diagnosis not present

## 2022-04-20 DIAGNOSIS — K9589 Other complications of other bariatric procedure: Secondary | ICD-10-CM | POA: Diagnosis not present

## 2022-04-20 DIAGNOSIS — Z903 Acquired absence of stomach [part of]: Secondary | ICD-10-CM | POA: Diagnosis not present

## 2022-05-07 IMAGING — DX DG CHEST 2V
2 series · 2 of 2 positions shown · non-contrast
Comparison: Chest x-ray 04/09/2017.

CLINICAL DATA: Midsternal chest pain.

EXAM:
CHEST - 2 VIEW

[chest pa]
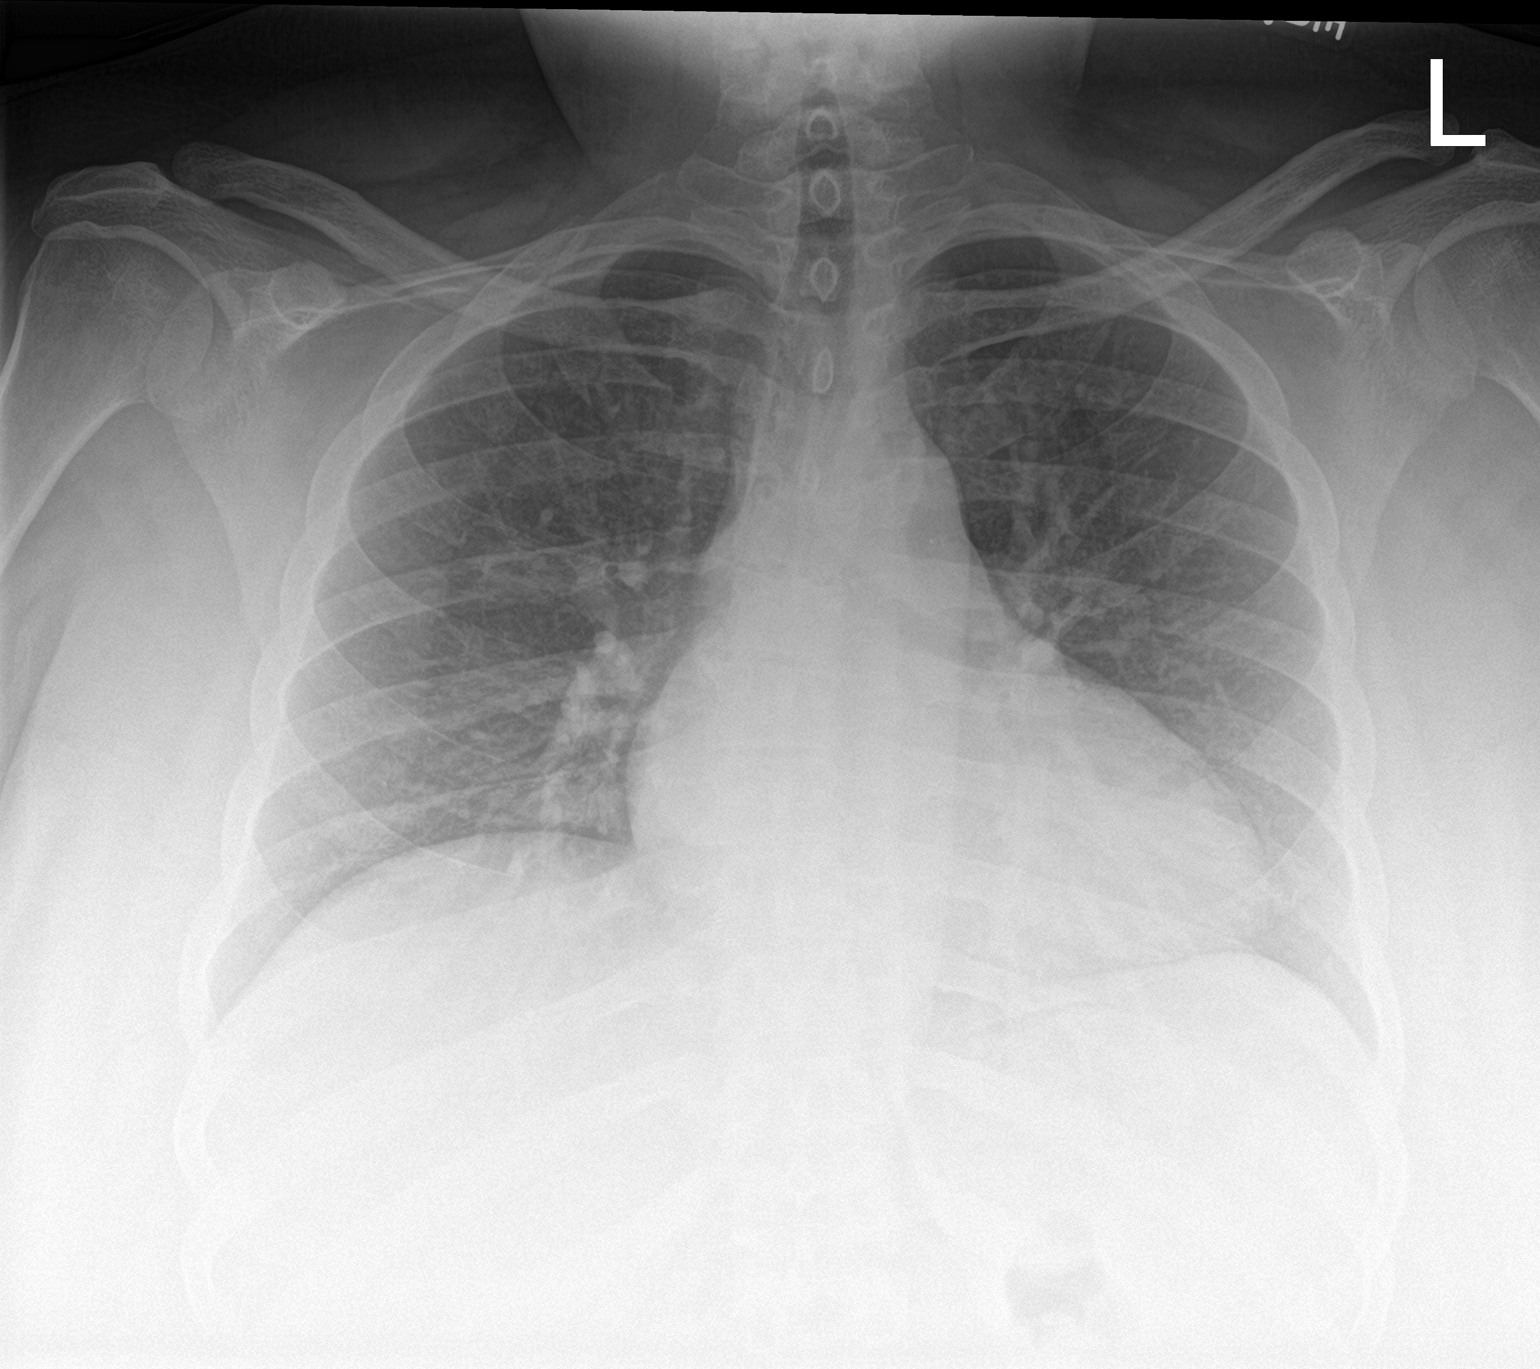

[chest lat]
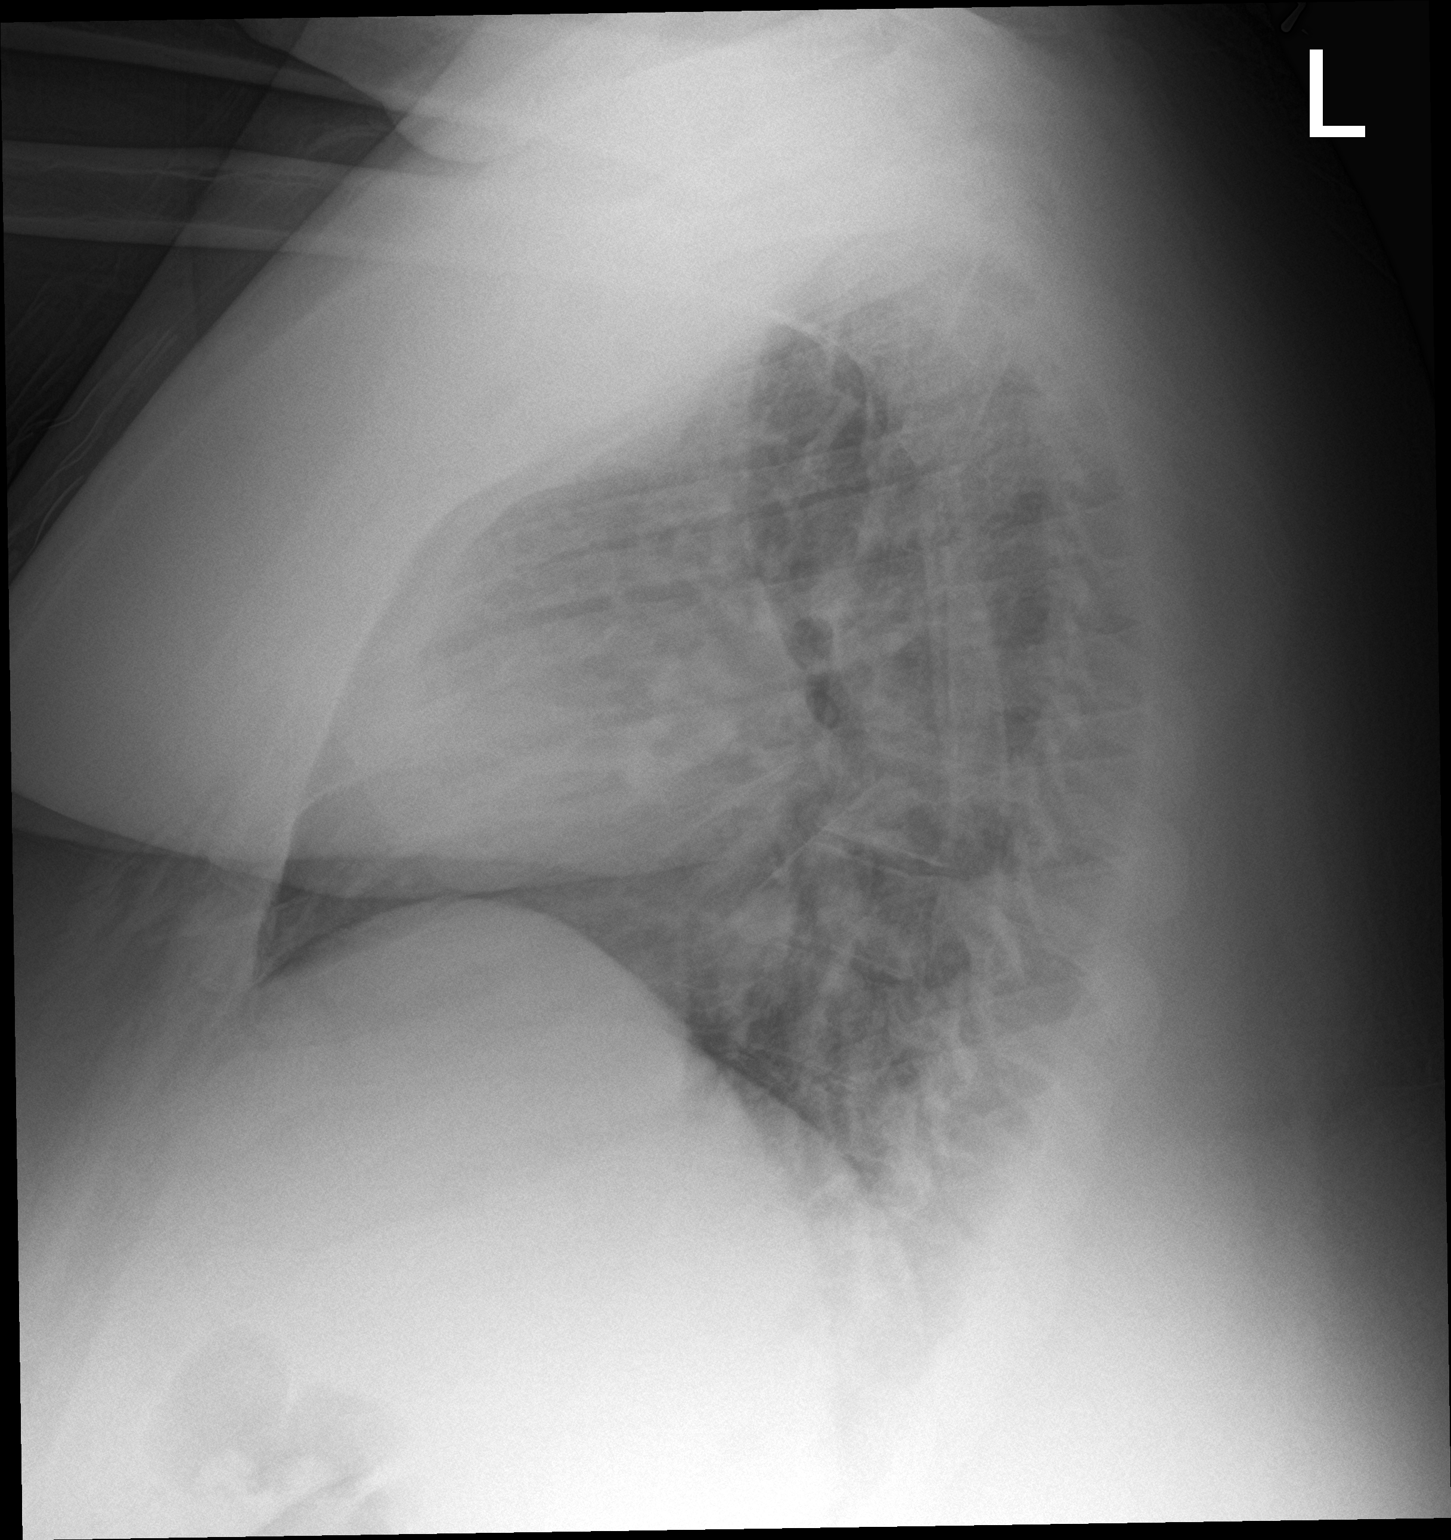

[2 of 2 positions shown; findings below may reference images not displayed]

FINDINGS: The heart size and mediastinal contours are within normal limits.
Both lungs are clear. The visualized skeletal structures are
unremarkable.
IMPRESSION: No active cardiopulmonary disease.

## 2022-08-29 ENCOUNTER — Encounter: Payer: Self-pay | Admitting: *Deleted
